# Patient Record
Sex: Female | Born: 1955 | Race: White | Hispanic: No | Smoking: Current every day smoker
Health system: Southern US, Community
[De-identification: ages and names within clinical notes are randomized; demographics above are authoritative.]

## PROBLEM LIST (undated history)

## (undated) DIAGNOSIS — C801 Malignant (primary) neoplasm, unspecified: Secondary | ICD-10-CM

## (undated) DIAGNOSIS — C50919 Malignant neoplasm of unspecified site of unspecified female breast: Secondary | ICD-10-CM

## (undated) DIAGNOSIS — M1990 Unspecified osteoarthritis, unspecified site: Secondary | ICD-10-CM

## (undated) DIAGNOSIS — C439 Malignant melanoma of skin, unspecified: Secondary | ICD-10-CM

## (undated) HISTORY — PX: TUBAL LIGATION: SHX77

## (undated) HISTORY — DX: Malignant (primary) neoplasm, unspecified: C80.1

## (undated) HISTORY — PX: LEG SURGERY: SHX1003

## (undated) HISTORY — DX: Malignant neoplasm of unspecified site of unspecified female breast: C50.919

## (undated) HISTORY — PX: OTHER SURGICAL HISTORY: SHX169

---

## 1998-10-09 DIAGNOSIS — C801 Malignant (primary) neoplasm, unspecified: Secondary | ICD-10-CM

## 1998-10-09 DIAGNOSIS — C50919 Malignant neoplasm of unspecified site of unspecified female breast: Secondary | ICD-10-CM

## 1998-10-09 HISTORY — DX: Malignant (primary) neoplasm, unspecified: C80.1

## 1998-10-09 HISTORY — DX: Malignant neoplasm of unspecified site of unspecified female breast: C50.919

## 1998-10-09 HISTORY — PX: BREAST SURGERY: SHX581

## 1999-06-14 ENCOUNTER — Encounter: Admission: RE | Admit: 1999-06-14 | Discharge: 1999-09-12 | Payer: Self-pay | Admitting: Radiation Oncology

## 2003-10-10 HISTORY — PX: FRACTURE SURGERY: SHX138

## 2005-06-18 ENCOUNTER — Emergency Department (HOSPITAL_COMMUNITY): Admission: EM | Admit: 2005-06-18 | Discharge: 2005-06-18 | Payer: Self-pay | Admitting: *Deleted

## 2006-12-10 ENCOUNTER — Ambulatory Visit: Payer: Self-pay | Admitting: Family Medicine

## 2008-06-22 ENCOUNTER — Other Ambulatory Visit: Admission: RE | Admit: 2008-06-22 | Discharge: 2008-06-22 | Payer: Self-pay | Admitting: Internal Medicine

## 2008-07-08 ENCOUNTER — Ambulatory Visit (HOSPITAL_COMMUNITY): Admission: RE | Admit: 2008-07-08 | Discharge: 2008-07-08 | Payer: Self-pay | Admitting: Internal Medicine

## 2008-07-29 ENCOUNTER — Ambulatory Visit (HOSPITAL_COMMUNITY): Admission: RE | Admit: 2008-07-29 | Discharge: 2008-07-29 | Payer: Self-pay | Admitting: Internal Medicine

## 2010-12-08 ENCOUNTER — Other Ambulatory Visit (HOSPITAL_COMMUNITY)
Admission: RE | Admit: 2010-12-08 | Discharge: 2010-12-08 | Disposition: A | Discharge status: Home / Self Care | Payer: 59 | Source: Ambulatory Visit | Attending: Family Medicine | Admitting: Family Medicine

## 2010-12-08 DIAGNOSIS — Z124 Encounter for screening for malignant neoplasm of cervix: Secondary | ICD-10-CM | POA: Insufficient documentation

## 2010-12-12 ENCOUNTER — Other Ambulatory Visit: Payer: Self-pay | Admitting: Family Medicine

## 2010-12-12 DIAGNOSIS — Z1231 Encounter for screening mammogram for malignant neoplasm of breast: Secondary | ICD-10-CM

## 2010-12-21 ENCOUNTER — Ambulatory Visit
Admission: RE | Admit: 2010-12-21 | Discharge: 2010-12-21 | Disposition: A | Discharge status: Home / Self Care | Payer: 59 | Source: Ambulatory Visit | Attending: Family Medicine | Admitting: Family Medicine

## 2010-12-21 DIAGNOSIS — Z1231 Encounter for screening mammogram for malignant neoplasm of breast: Secondary | ICD-10-CM

## 2012-02-05 ENCOUNTER — Encounter (INDEPENDENT_AMBULATORY_CARE_PROVIDER_SITE_OTHER): Payer: Self-pay

## 2012-02-09 ENCOUNTER — Encounter (INDEPENDENT_AMBULATORY_CARE_PROVIDER_SITE_OTHER): Payer: Self-pay | Admitting: Surgery

## 2012-02-09 ENCOUNTER — Ambulatory Visit (INDEPENDENT_AMBULATORY_CARE_PROVIDER_SITE_OTHER): Payer: Self-pay | Admitting: Surgery

## 2012-02-09 ENCOUNTER — Other Ambulatory Visit (INDEPENDENT_AMBULATORY_CARE_PROVIDER_SITE_OTHER): Payer: Self-pay

## 2012-02-09 VITALS — BP 108/70 | HR 68 | Temp 98.2°F | Resp 12 | Ht 68.0 in | Wt 198.0 lb

## 2012-02-09 DIAGNOSIS — D0362 Melanoma in situ of left upper limb, including shoulder: Secondary | ICD-10-CM

## 2012-02-09 DIAGNOSIS — C439 Malignant melanoma of skin, unspecified: Secondary | ICD-10-CM

## 2012-02-09 NOTE — Progress Notes (Signed)
Patient ID: Kaitlyn Martinez, female   DOB: 1956/08/16, 56 y.o.   MRN: [id number]  Chief Complaint  Patient presents with  . PT Initial Evaluation    Melanoma Left forearm    HPI Kaitlyn Martinez is a 56 y.o. female.   HPI Patient sent at the request of Dr. Terri Piedra for a melanoma on her left arm just above her wrist. He presented back in November as a clear lesion. It became red and then had a raised sore in the center of it. She underwent biopsy last month showed a superficial spreading melanoma Clark level 4     1.5 mm thickness with a brisk was response nourished in no vascular invasion. This is a T2anxmx. She denies a history of ulceration or bleeding from the area. She has had a left breast lumpectomy and a left axillary node dissection 2000 for breast cancer.  Past Medical History  Diagnosis Date  . 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 Breast cancer 2000    Past Surgical History  Procedure Date  . Breast surgery 2000    Lumpectomy    Family History  Problem Relation Age of Onset  . Heart disease Father     Social History History  Substance Use Topics  . Smoking status: Former Smoker -- 0.2 packs/day    Types: Cigarettes  . Smokeless tobacco: Not on file  . Alcohol Use: No    Allergies  Allergen Reactions  . Codeine   . Penicillins     Current Outpatient Prescriptions  Medication Sig Dispense Refill  . Multiple Vitamin (MULTIVITAMIN) tablet Take 1 tablet by mouth daily.        Review of Systems Review of Systems  Constitutional: Negative for fever, chills and unexpected weight change.  HENT: Negative for hearing loss, congestion, sore throat, trouble swallowing and voice change.   Eyes: Negative for visual disturbance.  Respiratory: Negative for cough and wheezing.   Cardiovascular: Negative for chest pain, palpitations and leg swelling.  Gastrointestinal: Negative for nausea, vomiting, abdominal pain, diarrhea, constipation, blood in stool, abdominal  distention and anal bleeding.  Genitourinary: Negative for hematuria, vaginal bleeding and difficulty urinating.  Musculoskeletal: Negative for arthralgias.  Skin: Negative for rash and wound.  Neurological: Negative for seizures, syncope and headaches.  Hematological: Negative for adenopathy. Does not bruise/bleed easily.  Psychiatric/Behavioral: Negative for confusion.    Blood pressure 108/70, pulse 68, temperature 98.2 F (36.8 C), temperature source Temporal, resp. rate 12, height 5\' 8"  (1.727 m), weight 198 lb (89.812 kg).  Physical Exam Physical Exam  Constitutional: She is oriented to person, place, and time. She appears well-developed and well-nourished.  HENT:  Head: Normocephalic and atraumatic.  Eyes: EOM are normal. Pupils are equal, round, and reactive to light.  Neck: Normal range of motion. Neck supple.  Cardiovascular: Normal rate and regular rhythm.   Pulmonary/Chest: Effort normal and breath sounds normal.  Abdominal: Soft. Bowel sounds are normal.  Musculoskeletal: Normal range of motion.  Neurological: She is alert and oriented to person, place, and time.  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 She has had a left axillary lymph node dissection which may make sentinel lymph node mapping not possible. I will set her up for a lymphoscintigraphy to evaluate lymphatic drainage in this extremity prior  surgery. If she does have lymphatics and to map, and a sentinel lymph node procedure could be attempted. Risk of bleeding, infection, lymphedema, and the need for further surgery possible complications of nerve injury, vascular injury, worsening of free operative state. She agrees to proceed. Palpation, alternatives to surgery and adjuvant therapies discussed the       Kaitlyn Martinez A. 02/09/2012, 10:32 AM

## 2012-02-09 NOTE — Patient Instructions (Signed)
Melanoma  Melanoma is the least common, but most dangerous, form of skin cancer. This is because it can spread (metastasize) to other organs and can be life-threatening. Melanoma is a cancerous (malignant) tumor that begins in a certain type of cells, called melanocytes. Melanocytes are the cells that produce the color (pigment) called melanin. Melanin colors our skin, hair, eyes, and moles.  CAUSES    The exact cause of melanoma is unknown. You may have a higher risk if you:   Spend or have spent a lot of time in the sun. This includes sunlamp and tanning booth exposure.    Have had sunburns. This put you at a particularly increased risk for melanoma. The more blistering sunburns a person has, the higher the risk.    Spend time in parts of the world with more intense sunlight.    Have fair skin that does not tan easily. You may have a lower risk if you have a darker skin color. However, people with darker skin can get melanoma, especially on the hands and feet (acral areas).    Have a close relative (parent, sibling) who has melanoma.    Have a large number of skin moles (more than 100).   SYMPTOMS    A skin mole is suspicious if it has any of these 5 traits. This is called the ABCDE's of melanoma:   Asymmetry: Irregular shape, not simply round or oval.    Border: Edge of the mole is irregular, not smooth.    Color: Mole may have multiple colors in it, including brown, black, blue, red, or tan.    Diameter: More than 0.2 inches (6 mm) across.    Evolving: Any unusual change or symptoms in the mole, such as pain, itching, stinging, sensitivity, or bleeding.   A mole that is noticeably changing in appearance, or any new mole, should be checked for melanoma. In general, people develop new moles until age 30. New moles after this age should be brought to the attention of your caregiver.  DIAGNOSIS     Your caregiver can look at your skin and find lesions or moles that may be suspicious. A patient may also notice a mole with symptoms or a mole that does not look like most of the other moles on his or her body. This is called the "ugly duckling" sign. A tissue sample (biopsy) examined under a microscope is needed to determine if it is melanoma. The size and extent of the biopsy will depend on the location, size, and appearance of the skin lesion or mole. The biopsy can also reveal whether melanoma has spread to deeper layers of the skin.  TREATMENT    Surgery to completely remove the melanoma is required. Lymph nodes may also be removed. If the melanoma has spread to other organs, such as the liver, lungs, bone, or brain, cancer-fighting drugs (chemotherapy) must be used. Your caregiver will discuss your treatment options with you. You can ask about being included in a clinical trial to evaluate new forms of treatment. Melanoma can occasionally recur years after the initial diagnosis. If you have melanoma, you will need follow-up visits with your caregiver for many years.  PREVENTION    Risk for melanoma can be reduced by minimizing sun exposure. Practice the 3 S's:   Slip on a shirt.    Slop on sunscreen.    Slap on a hat.   Do not spend time in the sun during peak midafternoon hours. Sunscreen/sunblock with SPF   30 or higher and UVA/UVB block should be applied regularly. You should do this even during brief exposure to sunlight. You should also do this on cloudy days and in winter, even though the perceived sunlight is less. Always avoid sunburn! Wear sunglasses that block UV light. Be sure to see your caregiver if you have any new or changing moles.  HOME CARE INSTRUCTIONS     Follow wound care instructions after surgical removal of your melanoma.    Practice good sun avoidance and protective measures as described above.     Let your close family members (parents, children, siblings) know about your diagnosis. This puts them at a higher risk of getting melanoma than the general population.   SEEK MEDICAL CARE IF:     You notice any new moles, or you have any moles that are changing.    You have had a melanoma removed and you notice a new growth near the same location.    You have had a melanoma removed and you experience any new or unexplained health problems.   Document Released: 09/25/2005 Document Revised: 09/14/2011 Document Reviewed: 01/14/2010  [employer] Patient Information 2012 [employer]

## 2012-02-15 ENCOUNTER — Encounter (HOSPITAL_COMMUNITY)
Admission: RE | Admit: 2012-02-15 | Discharge: 2012-02-15 | Disposition: A | Discharge status: Home / Self Care | Payer: Self-pay | Source: Ambulatory Visit | Attending: Surgery | Admitting: Surgery

## 2012-02-15 DIAGNOSIS — C439 Malignant melanoma of skin, unspecified: Secondary | ICD-10-CM

## 2012-02-15 DIAGNOSIS — C4360 Malignant melanoma of unspecified upper limb, including shoulder: Secondary | ICD-10-CM | POA: Insufficient documentation

## 2012-02-15 MED ORDER — TECHNETIUM TC 99M SULFUR COLLOID FILTERED
0.5000 | Freq: Once | INTRAVENOUS | Status: AC | PRN
  Administered 2012-02-15: 0.5 via INTRADERMAL

## 2012-03-05 ENCOUNTER — Encounter (HOSPITAL_BASED_OUTPATIENT_CLINIC_OR_DEPARTMENT_OTHER): Payer: Self-pay | Admitting: *Deleted

## 2012-03-05 NOTE — Progress Notes (Signed)
To come in for labs Told to call business office to set up some kind of payments

## 2012-03-06 ENCOUNTER — Encounter (HOSPITAL_BASED_OUTPATIENT_CLINIC_OR_DEPARTMENT_OTHER)
Admission: RE | Admit: 2012-03-06 | Discharge: 2012-03-06 | Disposition: A | Discharge status: Home / Self Care | Payer: Self-pay | Source: Ambulatory Visit | Attending: Surgery | Admitting: Surgery

## 2012-03-06 ENCOUNTER — Ambulatory Visit
Admission: RE | Admit: 2012-03-06 | Discharge: 2012-03-06 | Disposition: A | Discharge status: Home / Self Care | Payer: No Typology Code available for payment source | Source: Ambulatory Visit | Attending: Surgery | Admitting: Surgery

## 2012-03-06 LAB — COMPREHENSIVE METABOLIC PANEL
AST: 24 U/L (ref 0–37)
Albumin: 3.9 g/dL (ref 3.5–5.2)
Alkaline Phosphatase: 88 U/L (ref 39–117)
BUN: 11 mg/dL (ref 6–23)
Chloride: 103 mEq/L (ref 96–112)
Potassium: 4.8 mEq/L (ref 3.5–5.1)
Total Protein: 7.9 g/dL (ref 6.0–8.3)

## 2012-03-06 LAB — CBC
HCT: 41.5 % (ref 36.0–46.0)
MCHC: 32.8 g/dL (ref 30.0–36.0)
Platelets: 227 10*3/uL (ref 150–400)
RDW: 13.1 % (ref 11.5–15.5)
WBC: 7.8 10*3/uL (ref 4.0–10.5)

## 2012-03-06 LAB — DIFFERENTIAL
Eosinophils Absolute: 0.2 10*3/uL (ref 0.0–0.7)
Lymphs Abs: 2.2 10*3/uL (ref 0.7–4.0)
Monocytes Absolute: 0.6 10*3/uL (ref 0.1–1.0)
Monocytes Relative: 8 % (ref 3–12)
Neutro Abs: 4.8 10*3/uL (ref 1.7–7.7)
Neutrophils Relative %: 61 % (ref 43–77)

## 2012-03-07 ENCOUNTER — Encounter (HOSPITAL_BASED_OUTPATIENT_CLINIC_OR_DEPARTMENT_OTHER): Payer: Self-pay | Admitting: *Deleted

## 2012-03-07 ENCOUNTER — Encounter (HOSPITAL_BASED_OUTPATIENT_CLINIC_OR_DEPARTMENT_OTHER)
Admission: RE | Disposition: A | Discharge status: Home / Self Care | Payer: Self-pay | Source: Ambulatory Visit | Attending: Surgery

## 2012-03-07 ENCOUNTER — Ambulatory Visit (HOSPITAL_COMMUNITY)
Admission: RE | Admit: 2012-03-07 | Discharge: 2012-03-07 | Disposition: A | Discharge status: Home / Self Care | Payer: Self-pay | Source: Ambulatory Visit | Attending: Surgery | Admitting: Surgery

## 2012-03-07 ENCOUNTER — Ambulatory Visit (HOSPITAL_BASED_OUTPATIENT_CLINIC_OR_DEPARTMENT_OTHER)
Admission: RE | Admit: 2012-03-07 | Discharge: 2012-03-07 | Disposition: A | Discharge status: Home / Self Care | Payer: Self-pay | Source: Ambulatory Visit | Attending: Surgery | Admitting: Surgery

## 2012-03-07 ENCOUNTER — Ambulatory Visit (HOSPITAL_BASED_OUTPATIENT_CLINIC_OR_DEPARTMENT_OTHER): Payer: Self-pay | Admitting: *Deleted

## 2012-03-07 DIAGNOSIS — C4360 Malignant melanoma of unspecified upper limb, including shoulder: Secondary | ICD-10-CM | POA: Insufficient documentation

## 2012-03-07 DIAGNOSIS — D0362 Melanoma in situ of left upper limb, including shoulder: Secondary | ICD-10-CM

## 2012-03-07 DIAGNOSIS — Z853 Personal history of malignant neoplasm of breast: Secondary | ICD-10-CM | POA: Insufficient documentation

## 2012-03-07 HISTORY — PX: OTHER SURGICAL HISTORY: SHX169

## 2012-03-07 HISTORY — DX: Unspecified osteoarthritis, unspecified site: M19.90

## 2012-03-07 HISTORY — PX: MELANOMA EXCISION: SHX5266

## 2012-03-07 SURGERY — EXCISION, MELANOMA, WITH SENTINEL LYMPH NODE BIOPSY
Anesthesia: General | Site: Arm Lower | Laterality: Left | Wound class: Clean

## 2012-03-07 MED ORDER — ONDANSETRON HCL 4 MG/2ML IJ SOLN: 4.0000 mg | Freq: Once | INTRAMUSCULAR | Status: DC | PRN

## 2012-03-07 MED ORDER — MIDAZOLAM HCL 2 MG/2ML IJ SOLN
0.5000 mg | INTRAMUSCULAR | Status: DC | PRN
  Administered 2012-03-07: 1 mg via INTRAVENOUS

## 2012-03-07 MED ORDER — LIDOCAINE HCL (CARDIAC) 20 MG/ML IV SOLN
INTRAVENOUS | Status: DC | PRN
  Administered 2012-03-07: 50 mg via INTRAVENOUS

## 2012-03-07 MED ORDER — ONDANSETRON HCL 4 MG PO TABS: 4.0000 mg | ORAL_TABLET | Freq: Three times a day (TID) | ORAL | Status: AC | PRN

## 2012-03-07 MED ORDER — OXYCODONE-ACETAMINOPHEN 5-325 MG PO TABS
1.0000 | ORAL_TABLET | Freq: Once | ORAL | Status: AC | PRN
  Administered 2012-03-07: 1 via ORAL

## 2012-03-07 MED ORDER — PROPOFOL 10 MG/ML IV EMUL
INTRAVENOUS | Status: DC | PRN
  Administered 2012-03-07: 200 mg via INTRAVENOUS

## 2012-03-07 MED ORDER — LACTATED RINGERS IV SOLN
INTRAVENOUS | Status: DC
  Administered 2012-03-07 (×3): via INTRAVENOUS

## 2012-03-07 MED ORDER — TECHNETIUM TC 99M SULFUR COLLOID FILTERED
0.5000 | Freq: Once | INTRAVENOUS | Status: AC | PRN
  Administered 2012-03-07: 0.5 via INTRADERMAL

## 2012-03-07 MED ORDER — OXYCODONE-ACETAMINOPHEN 10-325 MG PO TABS: 1.0000 | ORAL_TABLET | Freq: Four times a day (QID) | ORAL | Status: DC | PRN

## 2012-03-07 MED ORDER — MIDAZOLAM HCL 5 MG/5ML IJ SOLN
INTRAMUSCULAR | Status: DC | PRN
  Administered 2012-03-07: 1 mg via INTRAVENOUS

## 2012-03-07 MED ORDER — HYDROMORPHONE HCL PF 1 MG/ML IJ SOLN
0.2500 mg | INTRAMUSCULAR | Status: DC | PRN
  Administered 2012-03-07: 0.25 mg via INTRAVENOUS
  Administered 2012-03-07 (×2): 0.5 mg via INTRAVENOUS
  Administered 2012-03-07: 0.25 mg via INTRAVENOUS

## 2012-03-07 MED ORDER — BUPIVACAINE-EPINEPHRINE 0.25% -1:200000 IJ SOLN
INTRAMUSCULAR | Status: DC | PRN
  Administered 2012-03-07: 20 mL

## 2012-03-07 MED ORDER — BUPIVACAINE HCL (PF) 0.25 % IJ SOLN
INTRAMUSCULAR | Status: DC | PRN
  Administered 2012-03-07: 9.5 mL

## 2012-03-07 MED ORDER — ONDANSETRON HCL 4 MG/2ML IJ SOLN
INTRAMUSCULAR | Status: DC | PRN
  Administered 2012-03-07: 4 mg via INTRAVENOUS

## 2012-03-07 MED ORDER — VANCOMYCIN HCL IN DEXTROSE 1-5 GM/200ML-% IV SOLN
1000.0000 mg | INTRAVENOUS | Status: AC
  Administered 2012-03-07: 1000 mg via INTRAVENOUS

## 2012-03-07 MED ORDER — DEXAMETHASONE SODIUM PHOSPHATE 4 MG/ML IJ SOLN
INTRAMUSCULAR | Status: DC | PRN
  Administered 2012-03-07: 10 mg via INTRAVENOUS

## 2012-03-07 MED ORDER — METHYLENE BLUE 1 % INJ SOLN
INTRAMUSCULAR | Status: DC | PRN
  Administered 2012-03-07: 1 mL via INTRADERMAL

## 2012-03-07 MED ORDER — FENTANYL CITRATE 0.05 MG/ML IJ SOLN
50.0000 ug | INTRAMUSCULAR | Status: DC | PRN
  Administered 2012-03-07: 50 ug via INTRAVENOUS

## 2012-03-07 MED ORDER — FENTANYL CITRATE 0.05 MG/ML IJ SOLN
INTRAMUSCULAR | Status: DC | PRN
  Administered 2012-03-07 (×3): 50 ug via INTRAVENOUS

## 2012-03-07 SURGICAL SUPPLY — 71 items
ADH SKN CLS APL DERMABOND .7 (GAUZE/BANDAGES/DRESSINGS) ×2
APL SKNCLS STERI-STRIP NONHPOA (GAUZE/BANDAGES/DRESSINGS) ×1
APPLIER CLIP 9.375 MED OPEN (MISCELLANEOUS) ×2
BALL CTTN LRG ABS STRL LF (GAUZE/BANDAGES/DRESSINGS) ×3
BANDAGE ELASTIC 4 VELCRO ST LF (GAUZE/BANDAGES/DRESSINGS) ×2 IMPLANT
BANDAGE ELASTIC 6 VELCRO ST LF (GAUZE/BANDAGES/DRESSINGS) IMPLANT
BANDAGE GAUZE ELAST BULKY 4 IN (GAUZE/BANDAGES/DRESSINGS) ×2 IMPLANT
BENZOIN TINCTURE PRP APPL 2/3 (GAUZE/BANDAGES/DRESSINGS) ×2 IMPLANT
BLADE SURG 10 STRL SS (BLADE) ×2 IMPLANT
BLADE SURG 15 STRL LF DISP TIS (BLADE) ×1 IMPLANT
BLADE SURG 15 STRL SS (BLADE) ×2
BLADE SURG ROTATE 9660 (MISCELLANEOUS) IMPLANT
BNDG PLASTER X FAST 3X3 WHT LF (CAST SUPPLIES) ×10 IMPLANT
CANISTER SUCTION 1200CC (MISCELLANEOUS) ×2 IMPLANT
CHLORAPREP W/TINT 26ML (MISCELLANEOUS) ×2 IMPLANT
CLIP APPLIE 9.375 MED OPEN (MISCELLANEOUS) ×1 IMPLANT
CLOTH BEACON ORANGE TIMEOUT ST (SAFETY) ×2 IMPLANT
COTTONBALL LRG STERILE PKG (GAUZE/BANDAGES/DRESSINGS) ×6 IMPLANT
COVER MAYO STAND STRL (DRAPES) ×2 IMPLANT
COVER PROBE W GEL 5X96 (DRAPES) ×2 IMPLANT
COVER TABLE BACK 60X90 (DRAPES) ×2 IMPLANT
DECANTER SPIKE VIAL GLASS SM (MISCELLANEOUS) IMPLANT
DERMABOND ADVANCED (GAUZE/BANDAGES/DRESSINGS) ×2
DERMABOND ADVANCED .7 DNX12 (GAUZE/BANDAGES/DRESSINGS) ×2 IMPLANT
DRAPE PED LAPAROTOMY (DRAPES) ×2 IMPLANT
DRAPE UTILITY XL STRL (DRAPES) ×2 IMPLANT
DRSG TEGADERM 4X4.75 (GAUZE/BANDAGES/DRESSINGS) ×4 IMPLANT
ELECT COATED BLADE 2.86 ST (ELECTRODE) ×2 IMPLANT
ELECT REM PT RETURN 9FT ADLT (ELECTROSURGICAL) ×2
ELECTRODE REM PT RTRN 9FT ADLT (ELECTROSURGICAL) ×1 IMPLANT
GAUZE SPONGE 4X4 12PLY STRL LF (GAUZE/BANDAGES/DRESSINGS) IMPLANT
GAUZE XEROFORM 5X9 LF (GAUZE/BANDAGES/DRESSINGS) ×2 IMPLANT
GLOVE BIOGEL M 7.0 STRL (GLOVE) ×2 IMPLANT
GLOVE BIOGEL PI IND STRL 7.5 (GLOVE) ×1 IMPLANT
GLOVE BIOGEL PI IND STRL 8 (GLOVE) ×1 IMPLANT
GLOVE BIOGEL PI INDICATOR 7.5 (GLOVE) ×1
GLOVE BIOGEL PI INDICATOR 8 (GLOVE) ×1
GLOVE ECLIPSE 8.0 STRL XLNG CF (GLOVE) ×2 IMPLANT
GOWN PREVENTION PLUS XLARGE (GOWN DISPOSABLE) ×4 IMPLANT
HEMOSTAT SURGICEL 2X14 (HEMOSTASIS) ×2 IMPLANT
NDL SAFETY ECLIPSE 18X1.5 (NEEDLE) IMPLANT
NEEDLE HYPO 18GX1.5 SHARP (NEEDLE)
NEEDLE HYPO 25X1 1.5 SAFETY (NEEDLE) ×2 IMPLANT
NS IRRIG 1000ML POUR BTL (IV SOLUTION) ×2 IMPLANT
PACK BASIN DAY SURGERY FS (CUSTOM PROCEDURE TRAY) ×2 IMPLANT
PENCIL BUTTON HOLSTER BLD 10FT (ELECTRODE) ×2 IMPLANT
SHEET MEDIUM DRAPE 40X70 STRL (DRAPES) ×2 IMPLANT
SLEEVE SCD COMPRESS KNEE MED (MISCELLANEOUS) ×2 IMPLANT
SPONGE GAUZE 4X4 12PLY (GAUZE/BANDAGES/DRESSINGS) ×2 IMPLANT
SPONGE LAP 4X18 X RAY DECT (DISPOSABLE) ×2 IMPLANT
STAPLER VISISTAT 35W (STAPLE) ×2 IMPLANT
STOCKINETTE 4X48 STRL (DRAPES) ×2 IMPLANT
STRIP CLOSURE SKIN 1/2X4 (GAUZE/BANDAGES/DRESSINGS) ×2 IMPLANT
SUT CHROMIC 3 0 PS 2 (SUTURE) ×10 IMPLANT
SUT ETHILON 2 0 FS 18 (SUTURE) ×6 IMPLANT
SUT ETHILON 3 0 PS 1 (SUTURE) ×2 IMPLANT
SUT MON AB 4-0 PC3 18 (SUTURE) ×2 IMPLANT
SUT SILK 2 0 SH (SUTURE) ×2 IMPLANT
SUT VIC AB 2-0 SH 27 (SUTURE)
SUT VIC AB 2-0 SH 27XBRD (SUTURE) IMPLANT
SUT VICRYL 3-0 CR8 SH (SUTURE) ×2 IMPLANT
SUT VICRYL 4-0 PS2 18IN ABS (SUTURE) ×2 IMPLANT
SYR BULB 3OZ (MISCELLANEOUS) ×2 IMPLANT
SYR CONTROL 10ML LL (SYRINGE) ×2 IMPLANT
SYR TB 1ML 26GX3/8 SAFETY (SYRINGE) ×2 IMPLANT
TOWEL OR 17X24 6PK STRL BLUE (TOWEL DISPOSABLE) ×2 IMPLANT
TOWEL OR NON WOVEN STRL DISP B (DISPOSABLE) ×2 IMPLANT
TUBE CONNECTING 20X1/4 (TUBING) ×2 IMPLANT
UNDERPAD 30X30 INCONTINENT (UNDERPADS AND DIAPERS) ×2 IMPLANT
WATER STERILE IRR 1000ML POUR (IV SOLUTION) IMPLANT
YANKAUER SUCT BULB TIP NO VENT (SUCTIONS) ×2 IMPLANT

## 2012-03-07 NOTE — Discharge Instructions (Signed)

## 2012-03-07 NOTE — Op Note (Signed)
Preoperative diagnosis: Left upper extremity melanoma  Postoperative diagnosis: Same  Procedure: Wide excision of left upper extremity melanoma with left axillary sentinel lymph node mapping and closure of defect with full-thickness 5 x 7 cm skin graft  Surgeon: Harriette Bouillon M.D.  Anesthesia: LMA with 0.25% Sensorcaine plain and 0.25% Sensorcaine with epinephrine local  EBL: Less than 30 cc  Specimen: Left forearm skin with melanoma and 2 left axillary sentinel lymph nodes to pathology  Drains: None  Indications for procedure: The patient presents for excision of left upper extremity melanoma located on the dorsal aspect of her left forearm approximately 5 inches above the wrist. This a large melanoma. Risk of bleeding, infection, blood vessel injury, nerve injury, the need for further surgery, arm swelling, shoulder pain, and the need for possible complex closure discussed. She understood the risk and agree to proceed.  Description of procedure: The patient was seen in the holding area. She was injected by nuclear medicine for her node mapping. Questions are answered. She's taken back to the operating room. The left upper shotty was marked in the holding area. After induction of LMA anesthesia under sterile condition I injected 4 cc of methylene blue dye into the melanoma located left forearm. We then prepped and draped left extremity a sterile fashion to include the axilla. Neoprobe was used to the iliac activity was in the left axilla. There is no activity in the left neck or supraclavicular region. Incision made a left axilla. Dissection was carried down to left axilla. She of previous left axillary lymph node dissection. 2 blue and hot nodes were identified and these are level II lymph nodes. No other nodes noted. Long thoracic and thoracodorsal trunk was not involved in the dissection. Surgicel placed in the wound.  Next, wide excision of melanoma of the left forearm was done. A 1 cm  margin was marked from the edge the melanoma circumferentially. Curvilinear incision was made around the melanoma. The lesion was excised down to the fascia of the forearm. The defect measured 5 x 7 cm. Given its location I mobilized the skin off the subcutaneous fat that is not good enough to close the defect without undue tension and loss of some motion at her wrist. I felt that full-thickness skin graft was necessary for closure. Donor site was chosen in the left inner upper arm where she excess skin. A mark was used and an elliptical 5 x 7 cm skin was excised the subcutaneous fat. The fat was removed from the skin. The skin was fenestrated with a scalpel. It was sewn into the defect with 3-0 chromic. Irrigation was used. A posterior was created using Xeroform and cotton balls and a single stitch of 2-0 nylon was placed to hold the posterior over the skin graft. The forearm was wrapped with Curlex, padding and a large Ace wrap. This was not too tight. Skin site closed with 4-0 Vicryl in a subcuticular stitch. Axilla closed with 3-0 Vicryl and 4-0 Monocryl. All final counts sponge, needle and this was found to be correct. Dermabond applied to done her site and left axilla. She was awoke extubated taken recovery in satisfactory condition.

## 2012-03-07 NOTE — Progress Notes (Signed)
Sentinal node injection complete.  Monitors and oxygen on throughout.  VS  WDL throughout.  Family to bedside, rails up.

## 2012-03-07 NOTE — Anesthesia Preprocedure Evaluation (Addendum)

## 2012-03-07 NOTE — H&P (View-Only) (Signed)
Patient ID: Kaitlyn Martinez, female   DOB: 01/23/1956, 56 y.o.   MRN: [id number]  Chief Complaint  Patient presents with  . PT Initial Evaluation    Melanoma Left forearm    HPI Kaitlyn Martinez is a 56 y.o. female.   HPI Patient sent at the request of Dr. Lupton for a melanoma on her left arm just above her wrist. He presented back in November as a clear lesion. It became red and then had a raised sore in the center of it. She underwent biopsy last month showed a superficial spreading melanoma Clark level 4     1.5 mm thickness with a brisk was response nourished in no vascular invasion. This is a T2anxmx. She denies a history of ulceration or bleeding from the area. She has had a left breast lumpectomy and a left axillary node dissection 2000 for breast cancer.  Past Medical History  Diagnosis Date  . 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 Breast cancer 2000    Past Surgical History  Procedure Date  . Breast surgery 2000    Lumpectomy    Family History  Problem Relation Age of Onset  . Heart disease Father     Social History History  Substance Use Topics  . Smoking status: Former Smoker -- 0.2 packs/day    Types: Cigarettes  . Smokeless tobacco: Not on file  . Alcohol Use: No    Allergies  Allergen Reactions  . Codeine   . Penicillins     Current Outpatient Prescriptions  Medication Sig Dispense Refill  . Multiple Vitamin (MULTIVITAMIN) tablet Take 1 tablet by mouth daily.        Review of Systems Review of Systems  Constitutional: Negative for fever, chills and unexpected weight change.  HENT: Negative for hearing loss, congestion, sore throat, trouble swallowing and voice change.   Eyes: Negative for visual disturbance.  Respiratory: Negative for cough and wheezing.   Cardiovascular: Negative for chest pain, palpitations and leg swelling.  Gastrointestinal: Negative for nausea, vomiting, abdominal pain, diarrhea, constipation, blood in stool, abdominal  distention and anal bleeding.  Genitourinary: Negative for hematuria, vaginal bleeding and difficulty urinating.  Musculoskeletal: Negative for arthralgias.  Skin: Negative for rash and wound.  Neurological: Negative for seizures, syncope and headaches.  Hematological: Negative for adenopathy. Does not bruise/bleed easily.  Psychiatric/Behavioral: Negative for confusion.    Blood pressure 108/70, pulse 68, temperature 98.2 F (36.8 C), temperature source Temporal, resp. rate 12, height 5' 8" (1.727 m), weight 198 lb (89.812 kg).  Physical Exam Physical Exam  Constitutional: She is oriented to person, place, and time. She appears well-developed and well-nourished.  HENT:  Head: Normocephalic and atraumatic.  Eyes: EOM are normal. Pupils are equal, round, and reactive to light.  Neck: Normal range of motion. Neck supple.  Cardiovascular: Normal rate and regular rhythm.   Pulmonary/Chest: Effort normal and breath sounds normal.  Abdominal: Soft. Bowel sounds are normal.  Musculoskeletal: Normal range of motion.  Neurological: She is alert and oriented to person, place, and time.  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 She has had a left axillary lymph node dissection which may make sentinel lymph node mapping not possible. I will set her up for a lymphoscintigraphy to evaluate lymphatic drainage in this extremity prior   surgery. If she does have lymphatics and to map, and a sentinel lymph node procedure could be attempted. Risk of bleeding, infection, lymphedema, and the need for further surgery possible complications of nerve injury, vascular injury, worsening of free operative state. She agrees to proceed. Palpation, alternatives to surgery and adjuvant therapies discussed the       [name] A. 02/09/2012, 10:32 AM    

## 2012-03-07 NOTE — Anesthesia Postprocedure Evaluation (Signed)
Anesthesia Post Note  Patient: Kaitlyn Martinez  Procedure(s) Performed: Procedure(s) (LRB): EXCISION MELANOMA WITH SENTINEL LYMPH NODE BIOPSY (Left)  Anesthesia type: general  Patient location: PACU  Post pain: Pain level controlled  Post assessment: Patient's Cardiovascular Status Stable  Last Vitals:  Filed Vitals:   03/07/12 1130  BP: 129/78  Pulse: 73  Resp: 12    Post vital signs: Reviewed and stable  Level of consciousness: sedated  Complications: No apparent anesthesia complications

## 2012-03-07 NOTE — Interval H&P Note (Signed)
History and Physical Interval Note:  03/07/2012 8:57 AM  Kaitlyn Martinez  has presented today for surgery, with the diagnosis of Melanoma Left Lower Arm  The various methods of treatment have been discussed with the patient and family. After consideration of risks, benefits and other options for treatment, the patient has consented to  Procedure(s) (LRB): EXCISION MELANOMA WITH SENTINEL LYMPH NODE BIOPSY (Left) as a surgical intervention .  The patients' history has been reviewed, patient examined, no change in status, stable for surgery.  I have reviewed the patients' chart and labs.  Questions were answered to the patient's satisfaction.     Eden Rho A.

## 2012-03-07 NOTE — Anesthesia Procedure Notes (Signed)
Procedure Name: LMA Insertion Date/Time: 03/07/2012 9:11 AM Performed by: Meyer Russel Pre-anesthesia Checklist: Patient identified, Emergency Drugs available, Suction available and Patient being monitored Patient Re-evaluated:Patient Re-evaluated prior to inductionOxygen Delivery Method: Circle System Utilized Preoxygenation: Pre-oxygenation with 100% oxygen Intubation Type: IV induction Ventilation: Mask ventilation without difficulty LMA: LMA inserted LMA Size: 4.0 Number of attempts: 1 Airway Equipment and Method: bite block Placement Confirmation: positive ETCO2 and breath sounds checked- equal and bilateral Tube secured with: Tape Dental Injury: Teeth and Oropharynx as per pre-operative assessment

## 2012-03-07 NOTE — Transfer of Care (Signed)
Immediate Anesthesia Transfer of Care Note  Patient: Kaitlyn Martinez  Procedure(s) Performed: Procedure(s) (LRB): EXCISION MELANOMA WITH SENTINEL LYMPH NODE BIOPSY (Left)  Patient Location: PACU  Anesthesia Type: General  Level of Consciousness: awake and alert   Airway & Oxygen Therapy: Patient Spontanous Breathing and Patient connected to face mask oxygen  Post-op Assessment: Report given to PACU RN, Post -op Vital signs reviewed and stable and Patient moving all extremities  Post vital signs: Reviewed and stable  Complications: No apparent anesthesia complications

## 2012-03-12 ENCOUNTER — Encounter (INDEPENDENT_AMBULATORY_CARE_PROVIDER_SITE_OTHER): Payer: Self-pay | Admitting: Surgery

## 2012-03-12 ENCOUNTER — Ambulatory Visit (INDEPENDENT_AMBULATORY_CARE_PROVIDER_SITE_OTHER): Payer: Self-pay | Admitting: Surgery

## 2012-03-12 VITALS — BP 106/68 | HR 79 | Temp 97.6°F | Ht 68.0 in | Wt 197.2 lb

## 2012-03-12 DIAGNOSIS — Z9889 Other specified postprocedural states: Secondary | ICD-10-CM

## 2012-03-12 NOTE — Progress Notes (Signed)
Patient returns in followup after excision of left forearm melanoma and full thickness skin graft. The final pathology is not out but I spoke with the lab it looks like the margins are negative and her 2 sentinel nodes are without metastasis. Final thickness appears to be 0.19 mm superficial spreading. I'm not sure this is total thickness or just a thickness in the specimen and will wait for final report.  Exam: I removed the dressing and bolster to her left forearm skin graft. The skin graft looks good. It appears to be taking. I placed Neosporin and Xeroform over it and we wrapped it. Her donor site and lymph node biopsy sites are clean dry and intact.  Impression: Superficial spreading melanoma left forearm status post wide excision with subsequent full thickness skin graft and sentinel lymph node mapping pathology pending  Plan: Return in one week. Continue wound care.

## 2012-03-12 NOTE — Patient Instructions (Signed)
Change dressing twice a day.  OK to shower.  Apply neosporin or polysporin to graft  Twice a day and cover with xeroform dressing and wrap with ACE wrap.

## 2012-03-18 ENCOUNTER — Ambulatory Visit (INDEPENDENT_AMBULATORY_CARE_PROVIDER_SITE_OTHER): Payer: Self-pay | Admitting: Surgery

## 2012-03-18 ENCOUNTER — Encounter (INDEPENDENT_AMBULATORY_CARE_PROVIDER_SITE_OTHER): Payer: Self-pay | Admitting: Surgery

## 2012-03-18 VITALS — BP 118/70 | HR 64 | Temp 97.8°F | Resp 16 | Ht 68.0 in | Wt 200.2 lb

## 2012-03-18 DIAGNOSIS — Z9889 Other specified postprocedural states: Secondary | ICD-10-CM

## 2012-03-18 NOTE — Progress Notes (Signed)
Patient returns after wide excision of left forearm melanoma. This was a T2aN0Mx.  she is doing well.  Exam: Left forearm wound healing well the skin graft. Excellent granulation with 100% take.  Skin harvest site and lymph node sites clean dry and intact. Impression: Stage II melanoma left forearm status post wide excision  Plan: To oncology. Continue present wound care.. Return in 2 weeks. He and range of motion exercises left wrist.

## 2012-03-18 NOTE — Patient Instructions (Signed)
Return 2 weeks.  Continue present wound care.

## 2012-03-22 ENCOUNTER — Telehealth: Payer: Self-pay | Admitting: Oncology

## 2012-03-22 NOTE — Telephone Encounter (Signed)
S/w pt re appt for 6/19 @ 10:30 am w/FS - pt will see FS instead of MM due to lack of availability.

## 2012-03-24 ENCOUNTER — Other Ambulatory Visit: Payer: Self-pay | Admitting: Oncology

## 2012-03-24 DIAGNOSIS — C439 Malignant melanoma of skin, unspecified: Secondary | ICD-10-CM

## 2012-03-26 ENCOUNTER — Telehealth: Payer: Self-pay | Admitting: Internal Medicine

## 2012-03-26 NOTE — Telephone Encounter (Signed)
Referred by Dr. Harriette Bouillon Dx- Melanoma

## 2012-03-27 ENCOUNTER — Other Ambulatory Visit (HOSPITAL_BASED_OUTPATIENT_CLINIC_OR_DEPARTMENT_OTHER): Payer: Self-pay | Admitting: Lab

## 2012-03-27 ENCOUNTER — Telehealth: Payer: Self-pay | Admitting: Oncology

## 2012-03-27 ENCOUNTER — Encounter: Payer: Self-pay | Admitting: Oncology

## 2012-03-27 ENCOUNTER — Ambulatory Visit: Payer: Self-pay

## 2012-03-27 ENCOUNTER — Ambulatory Visit (HOSPITAL_BASED_OUTPATIENT_CLINIC_OR_DEPARTMENT_OTHER): Payer: Self-pay | Admitting: Oncology

## 2012-03-27 VITALS — BP 120/76 | HR 75 | Temp 96.8°F | Ht 67.0 in | Wt 200.0 lb

## 2012-03-27 DIAGNOSIS — C4360 Malignant melanoma of unspecified upper limb, including shoulder: Secondary | ICD-10-CM

## 2012-03-27 DIAGNOSIS — C439 Malignant melanoma of skin, unspecified: Secondary | ICD-10-CM

## 2012-03-27 DIAGNOSIS — C50919 Malignant neoplasm of unspecified site of unspecified female breast: Secondary | ICD-10-CM

## 2012-03-27 LAB — CBC WITH DIFFERENTIAL/PLATELET
BASO%: 0.9 % (ref 0.0–2.0)
EOS%: 2.9 % (ref 0.0–7.0)
HCT: 39.2 % (ref 34.8–46.6)
LYMPH%: 30.4 % (ref 14.0–49.7)
MCH: 29.4 pg (ref 25.1–34.0)
MCHC: 33.6 g/dL (ref 31.5–36.0)
MONO%: 7.4 % (ref 0.0–14.0)
NEUT%: 58.4 % (ref 38.4–76.8)
Platelets: 199 10*3/uL (ref 145–400)
RBC: 4.48 10*6/uL (ref 3.70–5.45)

## 2012-03-27 LAB — COMPREHENSIVE METABOLIC PANEL
ALT: 23 U/L (ref 0–35)
AST: 17 U/L (ref 0–37)
Alkaline Phosphatase: 79 U/L (ref 39–117)
Creatinine, Ser: 0.76 mg/dL (ref 0.50–1.10)
Sodium: 141 mEq/L (ref 135–145)
Total Bilirubin: 0.2 mg/dL — ABNORMAL LOW (ref 0.3–1.2)

## 2012-03-27 LAB — LACTATE DEHYDROGENASE: LDH: 124 U/L (ref 94–250)

## 2012-03-27 NOTE — Telephone Encounter (Signed)
Gave pt appt for December 2013 lab and MD 

## 2012-03-27 NOTE — Progress Notes (Signed)
CC:   Frederick A. Worthy Rancher, M.D. Carilyn Goodpasture Docia Chuck, MD Clovis Pu. Cornett, M.D.  REFERRING PHYSICIAN:  Maisie Fus A. Cornett, M.D.  REASON FOR CONSULTATION:  Melanoma.  HISTORY OF PRESENT ILLNESS:  This is a pleasant 56 year old woman of [place], [place].  Lived the majority of her life around that area.  Has had multiple occupations, currently unemployed.  She has a past medical history significant for breast cancer diagnosed in 2001. She had a left breast lesion.  She underwent a lumpectomy, followed by radiation, followed by 5 years of tamoxifen, and had been in remission since that time and really does not get any routine oncology followup at this time.  She had noted a lesion on her left forearm initially for many years and most recently for the last 3 months and that area appeared to become scaly and painful and was referred to Dr. Terri Piedra, Dermatology in April 2013.  Upon his evaluation, he noted a left forearm 3.5 x 1.2-cm erythematous scaly hyperkeratotic plaque in the right crease of the arm and based on that, the patient underwent a shave biopsy of the left forearm.  That is the case number [id number].  The pathology showed a superficial spreading melanoma.  Clark level was IV, the Breslow measurements of 1.5 mm.  There are 4 mitotic figures per high-power field, no regression, no ulceration, and the initial staging was stage T2a.  Based on that, the patient was referred for a wide excision by Dr. Luisa Hart.  That was performed on Mar 07, 2012, as well as a sentinel lymph node biopsy.  The pathology from that procedure, case number [id number], showed a residual superficial spreading melanoma, 1.9 mm in the greatest depth, tumor confined to the papillary dermis, which was Clark level II.  Margins are negative.  The lymph nodes of the left axilla, 2 sentinel lymph nodes were identified; none of them had any malignancy, indicating a clinical staging of T2a.  The patient had  a chest x-ray which showed no evidence of any acute pulmonary process at this time.  She recovered fairly well from her operation.  She still has a bandage on that area, but it appears to be healing at this time. Clinically she has not had any other symptoms.  Had not had any chest pain, had not had any shortness of breath, had not had any skin lesions. She has not had any skin cancer or any lesions removed in the past.  She does report chronic skin damage from sun exposure, multiple sunburns in the past.  REVIEW OF SYSTEMS:  Does not report any headaches, blurry vision, double vision.  Does not report any motor or sensory neuropathy.  Does not report any alteration in mental status.  Does not report any psychiatric issues or depression.  Does not report any fever, chills, sweats.  Does not report any cough, hemoptysis, hematemesis.  No nausea or vomiting. No abdominal pain, hematochezia, melena.  No genitourinary complaints. Rest of review of systems is unremarkable.  PAST MEDICAL HISTORY:  Significant for the following: 1. History of breast cancer.  Details are unavailable to me at this     point.  Apparently had surgery in [facility].  She had a     lumpectomy, followed by radiation.  She had an axillary lymph node     sampling versus dissection; it is unclear to me which is which.     She has never really had any left lymphedema.  She was treated with  5 years of tamoxifen.  She could not really tell me whether she has     seen an oncologist.  She knows that she has seen a radiation     oncologist.  If she has seen a medical oncologist, she is not sure     who that is.  She does get routine mammograms on a regular basis. 2. No other past medical history.  She does not have any history of     hypertension, diabetes, coronary disease.  MEDICATIONS:  She is currently on multivitamins.  PAST SURGICAL HISTORY:  She had the breast cancer as mentioned.  She had right leg  surgery.  SOCIAL HISTORY:  She smokes a few cigarettes a day, but used to smoke about 1 pack a day.  She denied any alcohol.  She has 2 children.  She is married.  ALLERGIES:  Allergic to penicillin and codeine.  FAMILY HISTORY:  Her father died of cardiac complications.  Mother had pulmonary complications.  Really no history of any breast cancer.  She had an uncle that had melanoma.  PHYSICAL EXAMINATION:  General:  Alert, awake female, appeared in no active distress.  Vital Signs:  Her blood pressure today is 120/76, pulse is 75, respirations 20.  She is afebrile.  HEENT:  Head is normocephalic, atraumatic.  Pupils equal, round, reactive to light. Oral mucosa moist and pink.  Neck:  Supple without lymphadenopathy. Heart:  Regular rate and rhythm, S1 and S2.  Lungs:  Clear to auscultation.  No rhonchi, wheezes, or dullness to percussion.  Abdomen: Soft, nontender.  No hepatosplenomegaly.  Extremities:  No edema. Lymphatic Examination:  I could not palpate any lymphadenopathy in the axillary, supraclavicular, or cervical area.  Skin Examination:  No evidence of any erythema or lesions.  She has a left wrist bandage in place.  LABORATORY DATA:  Showed a hemoglobin of 13, white cell count 6.9, platelet count 199.  Chemistries including comprehensive metabolic panel and LDH are currently pending.  ASSESSMENT AND PLAN:  This is a pleasant 56 year old woman with the following issues: 1. Recent diagnosis of superficial spreading melanoma.  Her final     staging is stage I with 1.9-mm Breslow measurements and Clark level     of II.  She has 2 sentinel lymph nodes that were negative.  I had a     lengthy discussion today discussing the natural course of melanoma,     specifically early stage and the treatment approach.  At this time,     due to the fact that it is an early stage, low-risk disease, no     further therapy is warranted.  No adjuvant therapy at this point is     indicated  given the fact that it is a low risk.  Her risk of     relapse at this time is probably 10% or probably less.  So there is     really no role for adjuvant immune therapy and unlikely any     clinical trial would enroll such early stage, low-risk disease.  In     terms of further management, she will definitely need active     surveillance not only to monitor for relapse for this particular     melanoma, but also screen her for future risk of melanoma.  For     that reason, she will need dermatological followup on at least an     annual basis.  Followup with me every 6 months.  We will do     clinical labs, as well as imaging studies if symptoms arise.  At     this point, I do not think there is any need for abdominal imaging.     She has had a chest x-ray that was negative.  Certainly if she has     any abnormalities in her labs or symptomatology, then imaging will     be done at that time.  I have also advised her about skin     protection from UV light that includes using SPF of 30 or higher,     wearing long sleeve shirts and hats to limit UV exposure at that     time. 2. Breast cancer.  Again, seems to be remote, appears to be in     remission at this time.  Continue to be monitored with annual     mammograms and self-breast examination.  I also reminded her to     continue to do self skin examination at this time. All her questions were answered.  I will see her back in 6 months' time and reiterate some of these findings from today.    ______________________________ Benjiman Core, M.D. FNS/MEDQ  D:  03/27/2012  T:  03/27/2012  Job:  [id number]

## 2012-03-27 NOTE — Progress Notes (Signed)
New patient today, patient accompanied by her husband, patient has no insurance, gave patient EPP financial application, patient stated she has already applied for medicaid and does not qualify. Patient husband is unemployed and is recieveing unemployment. She will get EPP application back to me as soon as possible.

## 2012-03-27 NOTE — Progress Notes (Signed)
Note dictated

## 2012-04-09 ENCOUNTER — Encounter (INDEPENDENT_AMBULATORY_CARE_PROVIDER_SITE_OTHER): Payer: Self-pay | Admitting: Surgery

## 2012-04-09 ENCOUNTER — Ambulatory Visit (INDEPENDENT_AMBULATORY_CARE_PROVIDER_SITE_OTHER): Payer: Self-pay | Admitting: Surgery

## 2012-04-09 VITALS — BP 128/72 | HR 81 | Temp 97.9°F | Resp 14 | Ht 68.0 in | Wt 200.0 lb

## 2012-04-09 DIAGNOSIS — M7989 Other specified soft tissue disorders: Secondary | ICD-10-CM

## 2012-04-09 NOTE — Progress Notes (Signed)
Patient returned in followup after excision of left arm melanoma. She is doing well. She has developed left arm and hand lymphedema.  Exam: Skin graft site had 85% take. No signs of infection. She is 2+ pitting edema left upper extremity.  Impression: Status post excision left forearm melanoma  Plan: Return in 6 weeks. Lymphedema clinic. Continue present wound care.

## 2012-04-09 NOTE — Patient Instructions (Addendum)
Lymphedema Lymphedema is a swelling caused by the abnormal collection of lymph under the skin. The lymph is fluid from the tissues in your body that travels in the lymphatic system. This system is part of the immune system that includes lymph nodes and vessels. The lymph vessels collect and carry the excess fluid, fats, proteins, and wastes from the tissues of the body to the bloodstream. This system also works to clean and remove bacteria and waste products from the body.  Lymphedema occurs when the lymphatic system is blocked. When the lymph vessels or lymph nodes are blocked or damaged, lymph does not drain properly. This causes abnormal build up of lymph. This leads to swelling in the arms or legs. Lymphedema cannot be cured by medicines. But the swelling can be reduced by physical methods. CAUSES  There are two types of Lymphedema. Primary lymphedema is caused by the absence or abnormality of the lymph vessel at birth. It is also known as inherited lymphedema, which occurs rarely. Secondary or acquired lymphedema occurs when the lymph vessel is damaged or blocked. The causes of lymph vessel blockage are:   Skin infection like cellulites.   Infection by parasites (filariasis).   Injury.   Cancer.   Radiation therapy.   Formation of scar tissue.   Surgery.  SYMPTOMS  The symptoms of lymphedema are:  Abnormal swelling of the arm or leg.   Heavy or tight feeling in your arm or leg.   Tight-fitting shoes or rings.   Redness of skin over the affected area.   Limited movement of the affected limb.   Some patients complain about sensitivity to touch and discomfort in the limb(s) affected.  You may not have these symptoms immediately following injury. They usually appear within a few days or even years after injury. Inform your caregiver, if you have any of these symptoms. Early treatment can avoid further problems.  DIAGNOSIS  First, your caregiver will inquire about any surgery you  have had or medicines you are taking. He will then examine you. Your caregiver may order special imaging tests, such as:  Lymphoscintigraphy (a test in which a low dose of radioactive substance is injected to trace the flow of lymph through the lymph vessels).   MRI (imaging tests using sound waves).   Computed tomography (test using special cross-sectional X-rays).   Duplex ultrasound (test using high-frequency sound waves to show the vessels and the blood flow on a screen).   Lymphangiography (special X-ray taken after injecting a contrast dye into the lymph vessel). It is now rarely done.  TREATMENT  Lymphedema can be treated in different ways. Your caregiver will decide the type of treatment depending on the cause. Treatment may include:  Exercise: Special exercises will help fluid move out easily from the affected part. This should be done as per your caregiver's advice.   Manual lymph drainage: Gentle massage of the affected limb makes the fluid to move out more freely.   Compression: Compression stockings or external pump apply pressure over the affected limb. This helps the fluid to move out from the arm or leg. Bandaging can also help to move the fluid out from the affected part.  Your caregiver will decide the method that suits you the most.   Medicines: Your caregiver may prescribe antibiotics, if you have infection.   Surgery: Your caregiver may advise surgery for severe lymphedema. It is reserved for special cases when the patient has difficulty moving. Your surgeon may remove excess tissue from   the arm or leg. This will help to ease your movement. Physical therapy may have to be continued after surgery.  HOME CARE INSTRUCTIONS   Eat a healthy diet.   Exercise regularly as per advice.   Keep the affected area clean and dry.   Use gloves while cooking or gardening.   Protect your skin from cuts.   Use electric razor to shave the affected area.   Keep affected limb  elevated.   Do not wear tight clothes, shoes, or jewels as it may cause the tissue to be strangled.   Do not use heat pads over the affected area.   Do not sit with cross legs.   Do not walk barefoot.   Do not carry weight on the affected arm.   Avoid having blood pressure checked on the affected limb.   The area is very fragile and is predisposed to injury and infection.  SEEK MEDICAL CARE IF:  You continue to have swelling in your limb. SEEK IMMEDIATE MEDICAL CARE IF:   You have high fever.   You have skin rash.   You have chills or sweats.   You have pain or redness.   You have a cut that does not heal.  MAKE SURE YOU:   Understand these instructions.   Will watch your condition.   Will get help right away if you are not doing well or get worse.  Document Released: 07/23/2007 Document Revised: 09/14/2011 Document Reviewed: 06/28/2009 [employer] Patient Information 2012 [employer]. 

## 2012-04-19 ENCOUNTER — Encounter: Payer: Self-pay | Admitting: Oncology

## 2012-04-19 ENCOUNTER — Telehealth: Payer: Self-pay | Admitting: Oncology

## 2012-04-19 NOTE — Telephone Encounter (Signed)
Patient called about bills returned patient phone call explained to patient that according to our records she got approved for 100% discount, patient can call billing office to speak with them about the discount and they will adjust the bill that she has. I also explained to patient that any bill that she gets with the [employer] logo will be taken care of she just needs to call billing office when she gets the bill.

## 2012-04-24 ENCOUNTER — Ambulatory Visit: Payer: Self-pay | Attending: Surgery | Admitting: Physical Therapy

## 2012-04-24 DIAGNOSIS — IMO0001 Reserved for inherently not codable concepts without codable children: Secondary | ICD-10-CM | POA: Insufficient documentation

## 2012-04-24 DIAGNOSIS — I890 Lymphedema, not elsewhere classified: Secondary | ICD-10-CM | POA: Insufficient documentation

## 2012-04-29 ENCOUNTER — Ambulatory Visit: Payer: Self-pay

## 2012-05-01 ENCOUNTER — Ambulatory Visit: Payer: Self-pay

## 2012-05-16 ENCOUNTER — Encounter (INDEPENDENT_AMBULATORY_CARE_PROVIDER_SITE_OTHER): Payer: Self-pay | Admitting: Surgery

## 2012-05-16 ENCOUNTER — Ambulatory Visit (INDEPENDENT_AMBULATORY_CARE_PROVIDER_SITE_OTHER): Payer: Self-pay | Admitting: Surgery

## 2012-05-16 VITALS — BP 116/70 | HR 68 | Temp 97.3°F | Resp 16 | Ht 68.0 in | Wt 199.0 lb

## 2012-05-16 DIAGNOSIS — Z9889 Other specified postprocedural states: Secondary | ICD-10-CM

## 2012-05-16 NOTE — Progress Notes (Signed)
Patient returns in followup after excision of stage I left forearm melanoma. She developed some left arm lymphedema and went to the lymphedema clinic. It is improved improved dramatically. She is able to use her arm at all he has to swelling of her fingers now. Denies any significant pain. She is seen medical oncology and no adjuvant therapy recommended.  Exam: Left arm skin graft site has healed 100%. There is some hypo-pigmentation. Lymphedema is much improved and only involves her fingertips. Range of motion excellent. Donor site and lymph node harvest site clean dry intact.  Impression: Stage I left arm melanoma status post wide excision, full thickness skin graft, and left axillary sentinel lymph node mapping  Plan: Return in 6 months. Resume full activity. Lymphedema clinic as necessary. Her history of breast cancer noted. She is no evidence of recurrence of that.

## 2012-05-16 NOTE — Patient Instructions (Signed)
Return 6 months

## 2012-09-24 ENCOUNTER — Telehealth: Payer: Self-pay | Admitting: Oncology

## 2012-09-24 ENCOUNTER — Other Ambulatory Visit (HOSPITAL_BASED_OUTPATIENT_CLINIC_OR_DEPARTMENT_OTHER): Payer: Self-pay | Admitting: Lab

## 2012-09-24 ENCOUNTER — Ambulatory Visit (HOSPITAL_BASED_OUTPATIENT_CLINIC_OR_DEPARTMENT_OTHER): Payer: Self-pay | Admitting: Oncology

## 2012-09-24 VITALS — BP 129/73 | HR 62 | Temp 97.0°F | Resp 18 | Ht 68.0 in | Wt 201.6 lb

## 2012-09-24 DIAGNOSIS — C439 Malignant melanoma of skin, unspecified: Secondary | ICD-10-CM

## 2012-09-24 DIAGNOSIS — Z853 Personal history of malignant neoplasm of breast: Secondary | ICD-10-CM

## 2012-09-24 LAB — COMPREHENSIVE METABOLIC PANEL (CC13)
ALT: 22 U/L (ref 0–55)
Albumin: 3.5 g/dL (ref 3.5–5.0)
Alkaline Phosphatase: 72 U/L (ref 40–150)
Glucose: 108 mg/dl — ABNORMAL HIGH (ref 70–99)
Potassium: 4.4 mEq/L (ref 3.5–5.1)
Sodium: 141 mEq/L (ref 136–145)
Total Bilirubin: 0.42 mg/dL (ref 0.20–1.20)
Total Protein: 6.7 g/dL (ref 6.4–8.3)

## 2012-09-24 LAB — CBC WITH DIFFERENTIAL/PLATELET
BASO%: 0.8 % (ref 0.0–2.0)
Basophils Absolute: 0.1 10*3/uL (ref 0.0–0.1)
HCT: 37.1 % (ref 34.8–46.6)
HGB: 12.5 g/dL (ref 11.6–15.9)
MCH: 29.5 pg (ref 25.1–34.0)
MCV: 87.7 fL (ref 79.5–101.0)
MONO%: 7.9 % (ref 0.0–14.0)
NEUT%: 57.3 % (ref 38.4–76.8)
Platelets: 185 10*3/uL (ref 145–400)
lymph#: 2.2 10*3/uL (ref 0.9–3.3)

## 2012-09-24 NOTE — Telephone Encounter (Signed)
gv and printed appt schedule for pt for June 2014...the patient aware to go to radiology for x-ray b4 visit.

## 2012-09-24 NOTE — Progress Notes (Signed)
Hematology and Oncology Follow Up Visit  Kaitlyn Martinez [id number] [birth year] 56 y.o. 09/24/2012 10:37 AM    Principle Diagnosis: 56 year old with superficial spreading melanoma stage I with 1.9-mm Breslow measurements and Clark level  of II. Diagnosed 01/2012.  Prior Therapy: She is S/P a wide excision by Dr. Luisa Hart. That was performed on Mar 07, 2012, as well as a sentinel lymph node biopsy. The pathology from that procedure, case number [id number], showed a residual superficial spreading melanoma, 1.9  mm in the greatest depth, tumor confined to the papillary dermis, which was Clark level II. Margins are negative. The lymph nodes of the left axilla, 2 sentinel lymph nodes were identified; none of them had any malignancy.   Current therapy: Observation and follow up.   Interim History: Kaitlyn Martinez presents today for a follow up visit. She is a very nice women with the above issues. Since her last visit, she has been doing well at this time. Clinically she has not had any other symptoms. Had not had any chest pain, had not had any shortness of breath, had not had any skin lesions. She has not had any skin cancer or any lesions removed in the past. She continues to follow with her Dermatologist.   Medications: I have reviewed the patient's current medications. Current outpatient prescriptions:cetirizine (ZYRTEC) 10 MG chewable tablet, Chew 10 mg by mouth daily., Disp: , Rfl: ;  ibuprofen (ADVIL,MOTRIN) 200 MG tablet, Take 200 mg by mouth every 6 (six) hours as needed., Disp: , Rfl: ;  Multiple Vitamin (MULTIVITAMIN) tablet, Take 1 tablet by mouth daily., Disp: , Rfl:   Allergies:  Allergies  Allergen Reactions  . Codeine   . Penicillins     Past Medical History, Surgical history, Social history, and Family History were reviewed and updated.  Review of Systems: Constitutional:  Negative for fever, chills, night sweats, anorexia, weight loss, pain. Cardiovascular: no chest pain or  dyspnea on exertion Respiratory: negative Neurological: negative Dermatological: negative ENT: negative Skin: Negative. Gastrointestinal: negative Genito-Urinary: negative Hematological and Lymphatic: negative Breast: negative Musculoskeletal: negative Remaining ROS negative. Physical Exam: Blood pressure 129/73, pulse 62, temperature 97 F (36.1 C), temperature source Oral, resp. rate 18, height 5\' 8"  (1.727 m), weight 201 lb 9.6 oz (91.445 kg). ECOG: 1 General appearance: alert Head: Normocephalic, without obvious abnormality, atraumatic Neck: no adenopathy, no carotid bruit, no JVD, supple, symmetrical, trachea midline and thyroid not enlarged, symmetric, no tenderness/mass/nodules Lymph nodes: Cervical, supraclavicular, and axillary nodes normal. Heart:regular rate and rhythm, S1, S2 normal, no murmur, click, rub or gallop Lung:chest clear, no wheezing, rales, normal symmetric air entry Abdomin: soft, non-tender, without masses or organomegaly EXT:no erythema, induration, or nodules   Lab Results: Lab Results  Component Value Date   WBC 6.9 09/24/2012   HGB 12.5 09/24/2012   HCT 37.1 09/24/2012   MCV 87.7 09/24/2012   PLT 185 09/24/2012     Chemistry      Component Value Date/Time   NA 141 03/27/2012 1027   K 4.0 03/27/2012 1027   CL 107 03/27/2012 1027   CO2 25 03/27/2012 1027   BUN 10 03/27/2012 1027   CREATININE 0.76 03/27/2012 1027      Component Value Date/Time   CALCIUM 9.4 03/27/2012 1027   ALKPHOS 79 03/27/2012 1027   AST 17 03/27/2012 1027   ALT 23 03/27/2012 1027   BILITOT 0.2* 03/27/2012 1027      Impression and Plan:  This is a pleasant 56 year old woman with  the  following issues:  1. Recent diagnosis of superficial spreading melanoma. Her final staging is stage I with 1.9-mm Breslow measurements and Clark level of II. She has 2 sentinel lymph nodes that were negative. For now, we will continue active follow up every 6 months with exam, labs and CXR  every 12 months.    2. Breast cancer. Again, seems to be remote, appears to be in remission at this time. Continue to be monitored with annual  mammograms and self-breast examination.    Ledell Codrington, MD 12/17/201310:37 AM

## 2012-10-18 ENCOUNTER — Encounter (HOSPITAL_COMMUNITY): Payer: Self-pay | Admitting: Emergency Medicine

## 2012-10-18 ENCOUNTER — Emergency Department (HOSPITAL_COMMUNITY)
Admission: EM | Admit: 2012-10-18 | Discharge: 2012-10-19 | Disposition: A | Discharge status: Home / Self Care | Payer: Self-pay | Attending: Emergency Medicine | Admitting: Emergency Medicine

## 2012-10-18 DIAGNOSIS — K1120 Sialoadenitis, unspecified: Secondary | ICD-10-CM | POA: Insufficient documentation

## 2012-10-18 DIAGNOSIS — Z79899 Other long term (current) drug therapy: Secondary | ICD-10-CM | POA: Insufficient documentation

## 2012-10-18 DIAGNOSIS — F17200 Nicotine dependence, unspecified, uncomplicated: Secondary | ICD-10-CM | POA: Insufficient documentation

## 2012-10-18 DIAGNOSIS — Z85820 Personal history of malignant melanoma of skin: Secondary | ICD-10-CM | POA: Insufficient documentation

## 2012-10-18 DIAGNOSIS — Z9889 Other specified postprocedural states: Secondary | ICD-10-CM | POA: Insufficient documentation

## 2012-10-18 DIAGNOSIS — Z853 Personal history of malignant neoplasm of breast: Secondary | ICD-10-CM | POA: Insufficient documentation

## 2012-10-18 DIAGNOSIS — R51 Headache: Secondary | ICD-10-CM | POA: Insufficient documentation

## 2012-10-18 HISTORY — DX: Malignant melanoma of skin, unspecified: C43.9

## 2012-10-18 NOTE — ED Notes (Signed)
Pt reports swelling to L side of neck near L ear. Pt states swelling is getting worse since she has been here. Pt reports that she had a tooth pulled in mid December on that same side. Pt with no acute distress. Pt states it is getting harder for her to swallow due to the swelling.

## 2012-10-18 NOTE — ED Notes (Signed)
Pt c/o swollen area around L ear, onset yesterday.

## 2012-10-19 ENCOUNTER — Emergency Department (HOSPITAL_COMMUNITY): Payer: Self-pay

## 2012-10-19 LAB — CBC WITH DIFFERENTIAL/PLATELET
Eosinophils Relative: 1 % (ref 0–5)
HCT: 37 % (ref 36.0–46.0)
Lymphocytes Relative: 16 % (ref 12–46)
Lymphs Abs: 2.3 10*3/uL (ref 0.7–4.0)
MCV: 86.4 fL (ref 78.0–100.0)
Monocytes Absolute: 1.5 10*3/uL — ABNORMAL HIGH (ref 0.1–1.0)
Monocytes Relative: 11 % (ref 3–12)
RBC: 4.28 MIL/uL (ref 3.87–5.11)
WBC: 14.5 10*3/uL — ABNORMAL HIGH (ref 4.0–10.5)

## 2012-10-19 LAB — BASIC METABOLIC PANEL
CO2: 21 mEq/L (ref 19–32)
Calcium: 9.5 mg/dL (ref 8.4–10.5)
Creatinine, Ser: 0.67 mg/dL (ref 0.50–1.10)
Glucose, Bld: 116 mg/dL — ABNORMAL HIGH (ref 70–99)

## 2012-10-19 MED ORDER — HYDROMORPHONE HCL PF 1 MG/ML IJ SOLN
1.0000 mg | Freq: Once | INTRAMUSCULAR | Status: AC
  Administered 2012-10-19: 1 mg via INTRAVENOUS
  Filled 2012-10-19: qty 1

## 2012-10-19 MED ORDER — OXYCODONE-ACETAMINOPHEN 5-325 MG PO TABS: 2.0000 | ORAL_TABLET | ORAL | Status: DC | PRN

## 2012-10-19 MED ORDER — AMOXICILLIN-POT CLAVULANATE 875-125 MG PO TABS: 1.0000 | ORAL_TABLET | Freq: Two times a day (BID) | ORAL | Status: DC

## 2012-10-19 MED ORDER — HYDROMORPHONE HCL PF 1 MG/ML IJ SOLN
0.5000 mg | Freq: Once | INTRAMUSCULAR | Status: AC
  Administered 2012-10-19: 0.5 mg via INTRAVENOUS
  Filled 2012-10-19: qty 1

## 2012-10-19 MED ORDER — IOHEXOL 300 MG/ML  SOLN
100.0000 mL | Freq: Once | INTRAMUSCULAR | Status: AC | PRN
  Administered 2012-10-19: 100 mL via INTRAVENOUS

## 2012-10-19 MED ORDER — OXYCODONE-ACETAMINOPHEN 5-325 MG PO TABS
1.0000 | ORAL_TABLET | Freq: Once | ORAL | Status: AC
  Administered 2012-10-19: 1 via ORAL
  Filled 2012-10-19: qty 1

## 2012-10-19 NOTE — ED Notes (Signed)
Spoke with lab re BMP results. They advise to expect results in 10 min

## 2012-10-19 NOTE — ED Notes (Signed)
Pt in CT at this time.

## 2012-10-19 NOTE — ED Provider Notes (Signed)
History     CSN: [id number]  Arrival date & time 10/18/12  2245   First MD Initiated Contact with Patient 10/18/12 2346      Chief Complaint  Patient presents with  . Facial Swelling    (Consider location/radiation/quality/duration/timing/severity/associated sxs/prior treatment) HPI History provided by pt.   Pt had a left upper dental extraction approx 1 week ago.  Presents today w/ severe pain, edema and erythema of left cheek.  Pain radiates into her ear and left side of neck.  Denies fever, dental pain, dysphagia.  No trauma.    Past Medical History  Diagnosis Date  . Cancer 2000    breast  . Melanoma     Past Surgical History  Procedure Date  . Lumpectomy   . Leg surgery     No family history on file.  History  Substance Use Topics  . Smoking status: Current Every Day Smoker  . Smokeless tobacco: Not on file  . Alcohol Use: Yes     Comment: rarely    OB History    Grav Para Term Preterm Abortions TAB SAB Ect Mult Living                  Review of Systems  All other systems reviewed and are negative.    Allergies  Codeine and Penicillins  Home Medications   Current Outpatient Rx  Name  Route  Sig  Dispense  Refill  . IBUPROFEN 200 MG PO TABS   Oral   Take 800 mg by mouth every 6 (six) hours as needed.         . ADULT MULTIVITAMIN W/MINERALS CH   Oral   Take 1 tablet by mouth every morning.         Marland Kitchen AMOXICILLIN-POT CLAVULANATE 875-125 MG PO TABS   Oral   Take 1 tablet by mouth every 12 (twelve) hours.   14 tablet   0   . OXYCODONE-ACETAMINOPHEN 5-325 MG PO TABS   Oral   Take 2 tablets by mouth every 4 (four) hours as needed for pain.   20 tablet   0     BP 95/53  Pulse 71  Temp 98.5 F (36.9 C) (Oral)  Resp 18  Ht 5\' 8"  (1.727 m)  Wt 200 lb (90.719 kg)  BMI 30.41 kg/m2  SpO2 94%  Physical Exam  Nursing note and vitals reviewed. Constitutional: She is oriented to person, place, and time. She appears well-developed and  well-nourished. No distress.  HENT:  Head: Normocephalic and atraumatic.       Preauricular induration, edema, erythema and tendenress that spreads down to angle of jaw.  Nml external ear and external auditory canal as well as mastoid.  No trismus but opening mouth painful.  No palpable submandibular gland stone.    Eyes:       Normal appearance  Neck: Normal range of motion.  Cardiovascular: Normal rate and regular rhythm.   Pulmonary/Chest: Effort normal and breath sounds normal. No respiratory distress.  Musculoskeletal: Normal range of motion.  Lymphadenopathy:    She has no cervical adenopathy.  Neurological: She is alert and oriented to person, place, and time.  Skin: Skin is warm and dry. No rash noted.  Psychiatric: She has a normal mood and affect. Her behavior is normal.    ED Course  Procedures (including critical care time)  Labs Reviewed  CBC WITH DIFFERENTIAL - Abnormal; Notable for the following:    WBC 14.5 (*)  Neutro Abs 10.4 (*)     Monocytes Absolute 1.5 (*)     All other components within normal limits  BASIC METABOLIC PANEL - Abnormal; Notable for the following:    Glucose, Bld 116 (*)     All other components within normal limits   Dg Neck Soft Tissue  10/19/2012  *RADIOLOGY REPORT*  Clinical Data: Facial swelling, fever.  NECK SOFT TISSUES - 1+ VIEW  Comparison: None.  Findings: Epiglottis and aryepiglottic folds are normal. Retropharyngeal soft tissues normal.  Airway is patent.  No acute bony abnormality.  IMPRESSION: Unremarkable study.   Original Report Authenticated By: Charlett Nose, M.D.    Ct Maxillofacial W/cm  10/19/2012  *RADIOLOGY REPORT*  Clinical Data: Left facial swelling.  Recent tooth extraction.  CT MAXILLOFACIAL WITH CONTRAST  Technique:  Multidetector CT imaging of the maxillofacial structures was performed with intravenous contrast. Multiplanar CT image reconstructions were also generated.  Contrast: OMNIPAQUE IOHEXOL 300 MG/ML   SOLN  Comparison: None.  Findings: There is enlargement of the left parotid gland with heterogeneous enhancement.  Surrounding inflammatory change/stranding.  Findings compatible with parotitis.  No focal fluid collection to suggest abscess.  Left submandibular gland is enlarged relative to the right with mild enhancement.  Findings compatible with sialadenitis, either primary or reactive.  Mildly prominent, reactive cervical lymph nodes bilaterally.  Airway is patent.  Paranasal sinuses are clear.  Orbital soft tissues are unremarkable.  No acute bony abnormality.  IMPRESSION: Enlargement, enhancement and surrounding inflammatory change about the left parotid gland compatible with parotitis.  Left submandibular gland is also enlarged and enhancing compatible with sialadenitis, either primary or reactive.  No evidence of abscess.   Original Report Authenticated By: Charlett Nose, M.D.      1. Parotitis       MDM   [phone] F presents w/ non-traumatic L facial edema, erythema, induration and pain.  Recent dental extraction.  CT maxillofacial shows parotitis w/out abscess and sialoadenitis.  Discussed w/ Dr. Dierdre Highman who recommends augmentin and analgesic.  I recommended sucking on sour candy as well.  Referred to ENT.  Return precautions discussed.        Otilio Miu, PA-C 10/19/12 [phone]

## 2012-10-20 NOTE — ED Provider Notes (Signed)
Medical screening examination/treatment/procedure(s) were performed by non-physician practitioner and as supervising physician I was immediately available for consultation/collaboration.  Sunnie Nielsen, MD 10/20/12 [phone]

## 2012-10-25 ENCOUNTER — Encounter (INDEPENDENT_AMBULATORY_CARE_PROVIDER_SITE_OTHER): Payer: Self-pay | Admitting: Surgery

## 2013-03-17 ENCOUNTER — Ambulatory Visit (HOSPITAL_COMMUNITY)
Admission: RE | Admit: 2013-03-17 | Discharge: 2013-03-17 | Disposition: A | Discharge status: Home / Self Care | Payer: Self-pay | Source: Ambulatory Visit | Attending: Oncology | Admitting: Oncology

## 2013-03-17 ENCOUNTER — Other Ambulatory Visit: Payer: Self-pay | Admitting: Oncology

## 2013-03-17 DIAGNOSIS — R059 Cough, unspecified: Secondary | ICD-10-CM | POA: Insufficient documentation

## 2013-03-17 DIAGNOSIS — R05 Cough: Secondary | ICD-10-CM | POA: Insufficient documentation

## 2013-03-17 DIAGNOSIS — F17200 Nicotine dependence, unspecified, uncomplicated: Secondary | ICD-10-CM | POA: Insufficient documentation

## 2013-03-17 DIAGNOSIS — C439 Malignant melanoma of skin, unspecified: Secondary | ICD-10-CM

## 2013-03-18 ENCOUNTER — Other Ambulatory Visit (HOSPITAL_BASED_OUTPATIENT_CLINIC_OR_DEPARTMENT_OTHER): Payer: Self-pay | Admitting: Lab

## 2013-03-18 ENCOUNTER — Ambulatory Visit (HOSPITAL_BASED_OUTPATIENT_CLINIC_OR_DEPARTMENT_OTHER): Payer: Self-pay | Admitting: Oncology

## 2013-03-18 ENCOUNTER — Telehealth: Payer: Self-pay | Admitting: Oncology

## 2013-03-18 VITALS — BP 113/68 | HR 69 | Temp 97.3°F | Resp 18 | Ht 68.0 in | Wt 198.7 lb

## 2013-03-18 DIAGNOSIS — C439 Malignant melanoma of skin, unspecified: Secondary | ICD-10-CM

## 2013-03-18 LAB — CBC WITH DIFFERENTIAL/PLATELET
BASO%: 1.5 % (ref 0.0–2.0)
EOS%: 1.9 % (ref 0.0–7.0)
Eosinophils Absolute: 0.1 10*3/uL (ref 0.0–0.5)
LYMPH%: 28.8 % (ref 14.0–49.7)
MCH: 29.5 pg (ref 25.1–34.0)
MCHC: 34.4 g/dL (ref 31.5–36.0)
MCV: 85.8 fL (ref 79.5–101.0)
MONO%: 7.3 % (ref 0.0–14.0)
NEUT#: 4.5 10*3/uL (ref 1.5–6.5)
Platelets: 188 10*3/uL (ref 145–400)
RBC: 4.47 10*6/uL (ref 3.70–5.45)
RDW: 13.2 % (ref 11.2–14.5)

## 2013-03-18 LAB — COMPREHENSIVE METABOLIC PANEL (CC13)
ALT: 25 U/L (ref 0–55)
AST: 17 U/L (ref 5–34)
Albumin: 3.6 g/dL (ref 3.5–5.0)
Alkaline Phosphatase: 78 U/L (ref 40–150)
Glucose: 94 mg/dl (ref 70–99)
Potassium: 4.1 mEq/L (ref 3.5–5.1)
Sodium: 141 mEq/L (ref 136–145)
Total Bilirubin: 0.4 mg/dL (ref 0.20–1.20)
Total Protein: 7.2 g/dL (ref 6.4–8.3)

## 2013-03-18 NOTE — Progress Notes (Signed)
Hematology and Oncology Follow Up Visit  Kaitlyn Martinez [id number] 03-01-1956 57 y.o. 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 Diagnosed 01/2012.  Prior Therapy: She is S/P a wide excision by Dr. Luisa Hart. That was performed on Mar 07, 2012, as well as a sentinel lymph node biopsy. The pathology from that procedure, case number [id number], showed a residual superficial spreading melanoma, 1.9 mm in the greatest depth, tumor confined to the papillary dermis, which was Clark level II. Margins are negative. The lymph nodes of the left axilla, 2 sentinel lymph nodes wereidentified; none of them had any malignancy.   Current therapy: Observation and follow up.   Interim History: [name] presents today for a follow up visit. She is a very nice women with the above issues. Since her last visit, she has been doing well at this time. Clinically she has not had any other symptoms. Had not had any chest pain, had not had any shortness of breath, had not had any skin lesions. She has not had any skin cancer or any lesions removed in the past. She continues to follow with her Dermatologist. She is continuing to protect her skin with sun block and wearing hats.   Medications: I have reviewed the patient's current medications. Current Outpatient Prescriptions  Medication Sig Dispense Refill  . cetirizine (ZYRTEC) 10 MG chewable tablet Chew 10 mg by mouth daily.      Marland Kitchen ibuprofen (ADVIL,MOTRIN) 200 MG tablet Take 200 mg by mouth every 6 (six) hours as needed.      . Multiple Vitamin (MULTIVITAMIN) tablet Take 1 tablet by mouth daily.       No current facility-administered medications for this visit.    Allergies:  Allergies  Allergen Reactions  . Codeine   . Penicillins     Past Medical History, Surgical history, Social history, and Family History were reviewed and  updated.  Review of Systems: Constitutional:  Negative for fever, chills, night sweats, anorexia, weight loss, pain. Cardiovascular: no chest pain or dyspnea on exertion Respiratory: negative Neurological: negative Dermatological: negative ENT: negative Skin: Negative. Gastrointestinal: negative Genito-Urinary: negative Hematological and Lymphatic: negative Breast: negative Musculoskeletal: negative Remaining ROS negative. Physical Exam: Blood pressure 113/68, pulse 69, temperature 97.3 F (36.3 C), temperature source Oral, resp. rate 18, height 5\' 8"  (1.727 m), weight 198 lb 11.2 oz (90.13 kg). ECOG: 1 General appearance: alert Head: Normocephalic, without obvious abnormality, atraumatic Neck: no adenopathy, no carotid bruit, no JVD, supple, symmetrical, trachea midline and thyroid not enlarged, symmetric, no tenderness/mass/nodules Lymph nodes: Cervical, supraclavicular, and axillary nodes normal. Heart:regular rate and rhythm, S1, S2 normal, no murmur, click, rub or gallop Lung:chest clear, no wheezing, rales, normal symmetric air entry Abdomin: soft, non-tender, without masses or organomegaly EXT:no erythema, induration, or nodules   Lab Results: Lab Results  Component Value Date   WBC 7.4 03/18/2013   HGB 13.2 03/18/2013   HCT 38.3 03/18/2013   MCV 85.8 03/18/2013   PLT 188 03/18/2013     Chemistry      Component Value Date/Time   NA 141 03/18/2013 0941   NA 141 03/27/2012 1027   K 4.1 03/18/2013 0941   K 4.0 03/27/2012 1027   CL 107 03/18/2013 0941   CL 107 03/27/2012 1027   CO2 25 03/18/2013 0941   CO2 25 03/27/2012 1027   BUN 11.6 03/18/2013 0941   BUN 10  03/27/2012 1027   CREATININE 0.9 03/18/2013 0941   CREATININE 0.76 03/27/2012 1027      Component Value Date/Time   CALCIUM 9.7 03/18/2013 0941   CALCIUM 9.4 03/27/2012 1027   ALKPHOS 78 03/18/2013 0941   ALKPHOS 79 03/27/2012 1027   AST 17 03/18/2013 0941   AST 17 03/27/2012 1027   ALT 25 03/18/2013 0941   ALT 23  03/27/2012 1027   BILITOT 0.40 03/18/2013 0941   BILITOT 0.2* 03/27/2012 1027     Clinical Data: Melanoma of the skin. Cough. Smoker.  CHEST - 2 VIEW  Comparison: 03/06/2012  Findings: The heart size and mediastinal contours are within normal  limits. Both lungs are clear. The visualized skeletal structures  are unremarkable.  IMPRESSION:  Negative exam.  Impression and Plan:  This is a pleasant 57 year old woman with the  following issues:  1. Superficial spreading melanoma. Her final staging is stage I with 1.9-mm Breslow measurements and Clark level of II. She has 2 sentinel lymph nodes that were negative. Her exam, labs and CXR showed no abnormalities. For now, we will continue active follow up every 6 months with exam, labs and CXR every 12 months.   2. Breast cancer. Again, seems to be remote, appears to be in remission at this time. Continue to be monitored with annual  mammograms and self-breast examination.    Eli Hose, MD 6/10/201410:32 AM

## 2013-06-17 IMAGING — CT CT MAXILLOFACIAL W/ CM
1 series · 15 of 30 positions shown, 19 images · IV contrast (OMNIPAQUE 300)
Comparison: None.

CLINICAL DATA: Left facial swelling.  Recent tooth extraction.

CT MAXILLOFACIAL WITH CONTRAST
TECHNIQUE: Multidetector CT imaging of the maxillofacial
structures was performed with intravenous contrast. Multiplanar CT
image reconstructions were also generated.
Contrast: 100mL OMNIPAQUE IOHEXOL 300 MG/ML  SOLN

[Series 3: facial st · axial · 0.37mm/px · z∈[+54,+218]mm · 15 of 90 slices shown, 19 images]
[im 4/90  brain]
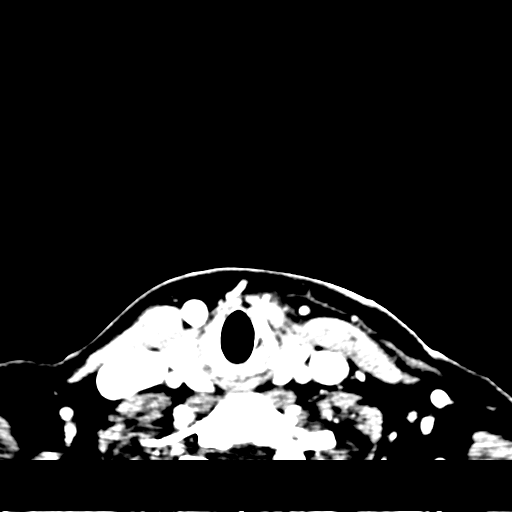
[im 4/90  bone]
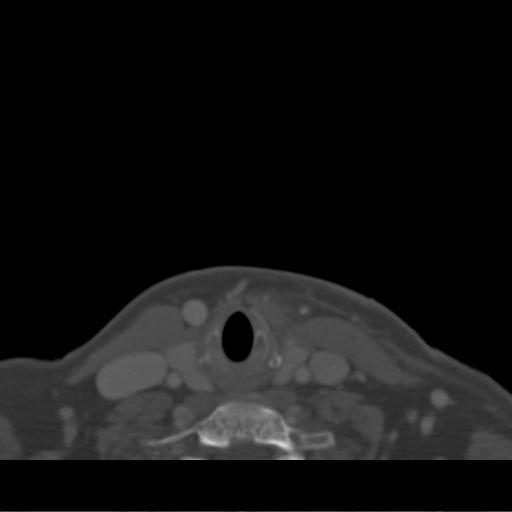
[im 10/90  bone]
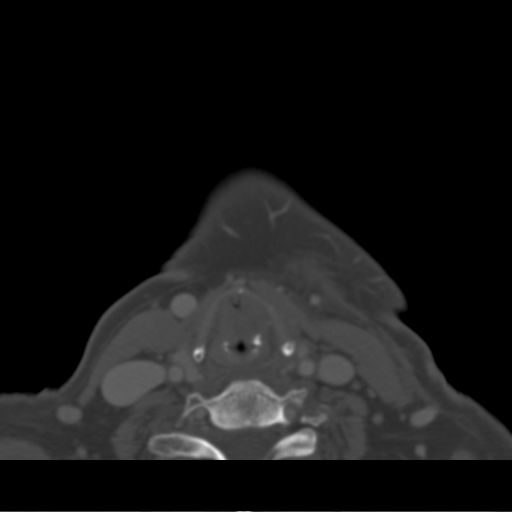
[im 16/90  bone]
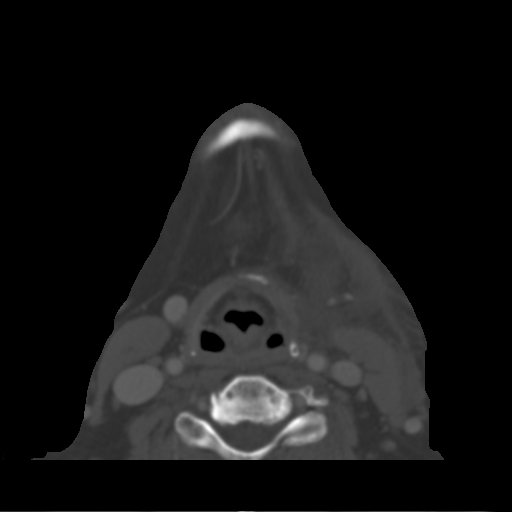
[im 22/90  bone]
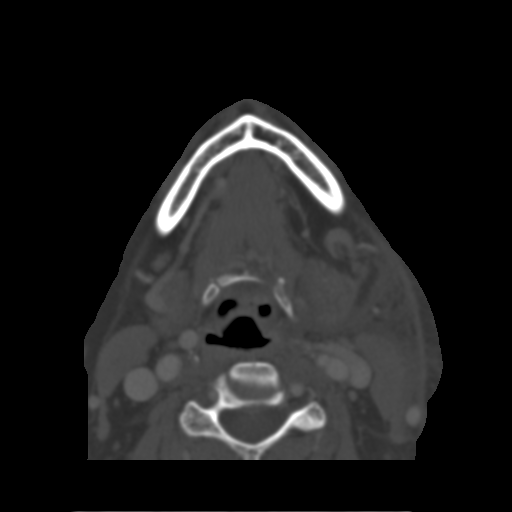
[im 28/90  brain]
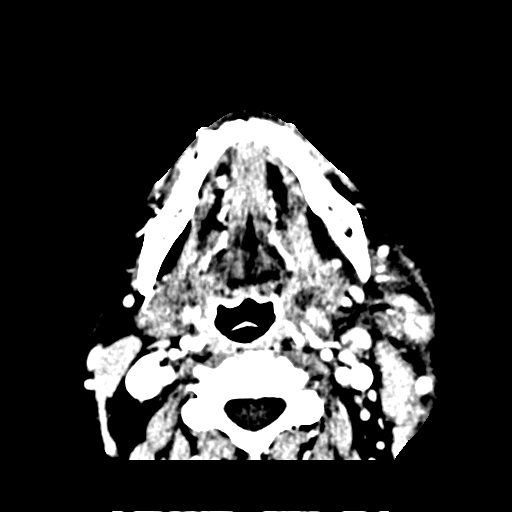
[im 28/90  bone]
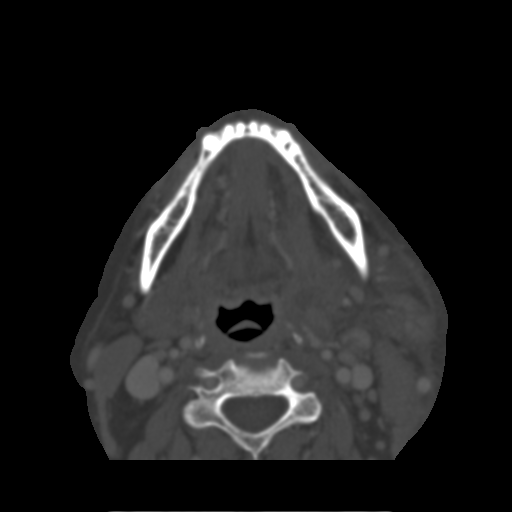
[im 34/90  bone]
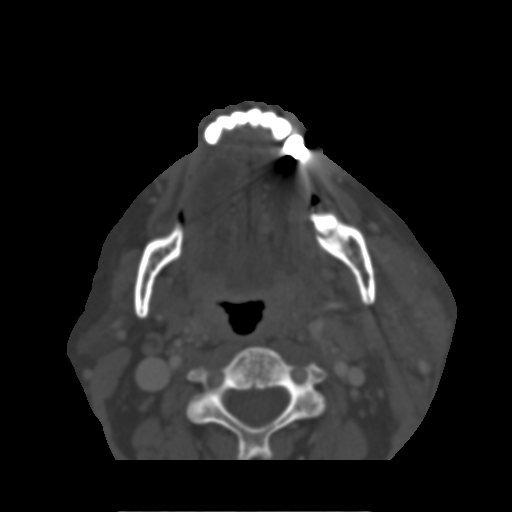
[im 40/90  bone]
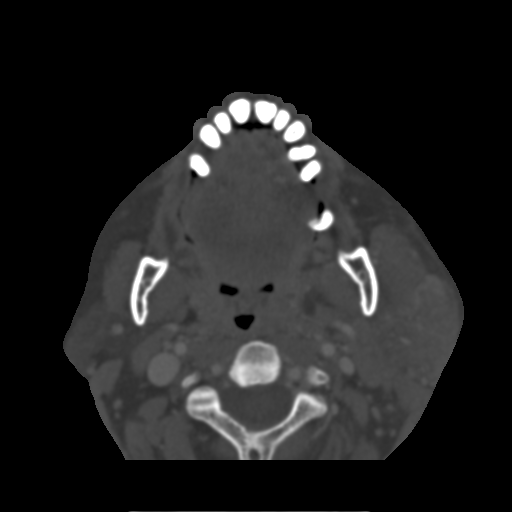
[im 47/90  bone]
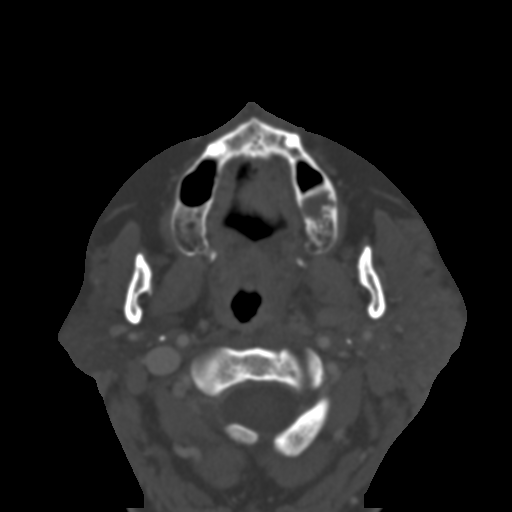
[im 50/90  brain]
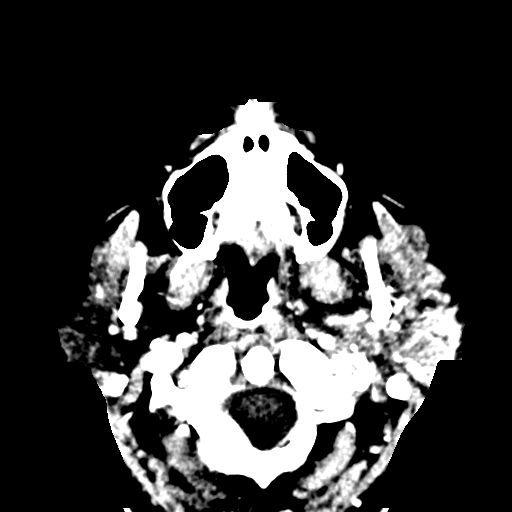
[im 50/90  bone]
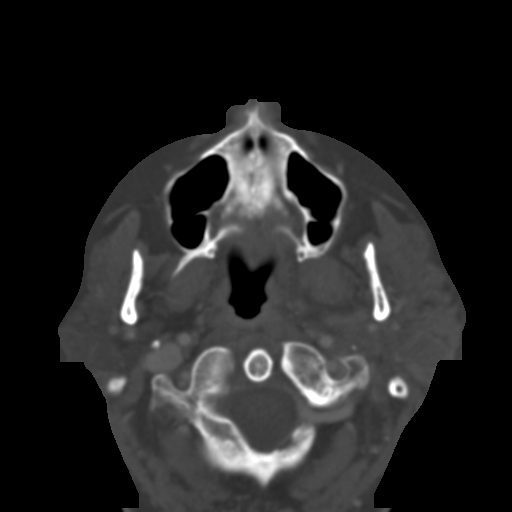
[im 56/90  bone]
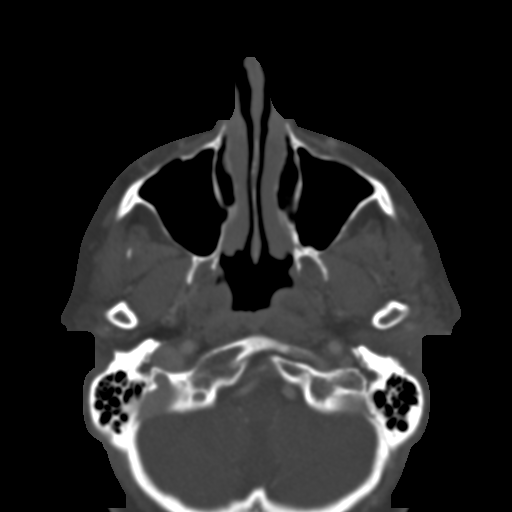
[im 62/90  bone]
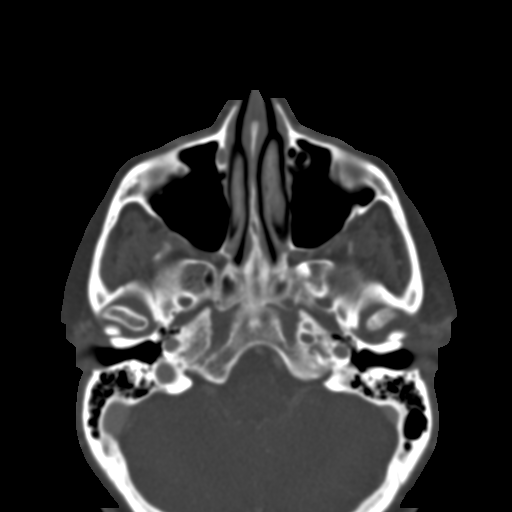
[im 68/90  bone]
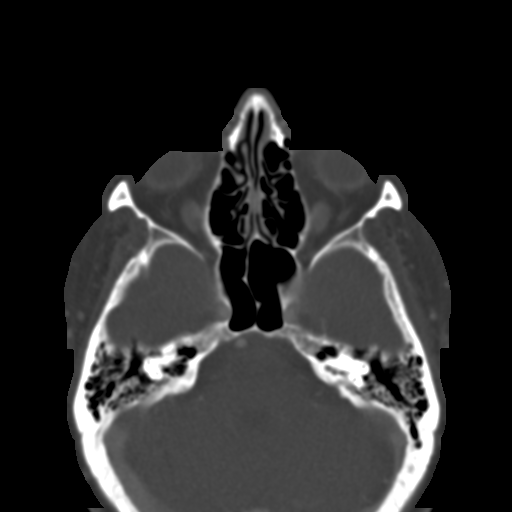
[im 74/90  brain]
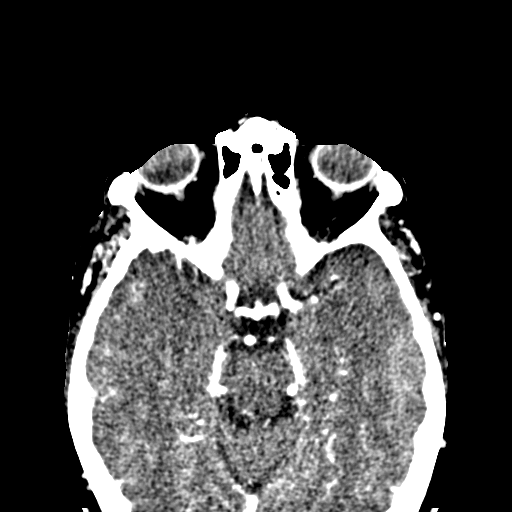
[im 74/90  bone]
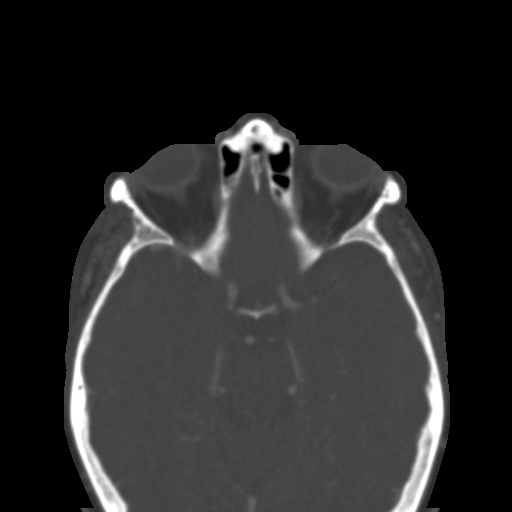
[im 80/90  bone]
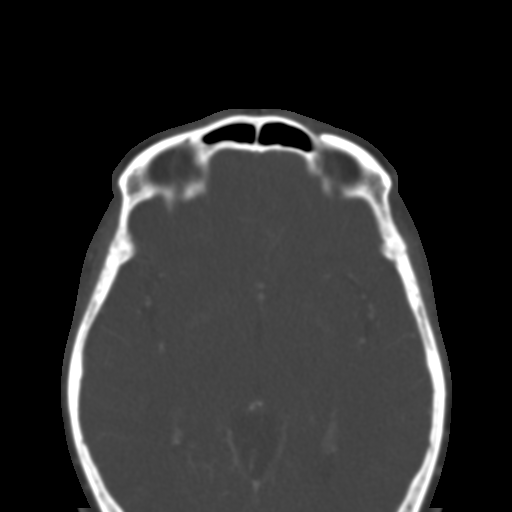
[im 86/90  bone]
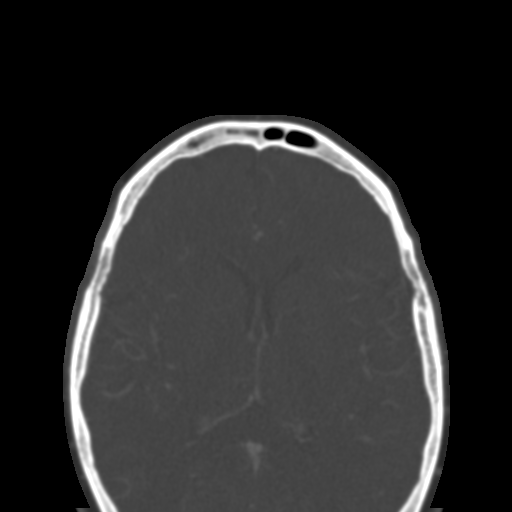

[15 of 30 positions shown; findings below may reference images not displayed]

FINDINGS: There is enlargement of the left parotid gland with
heterogeneous enhancement.  Surrounding inflammatory
change/stranding.  Findings compatible with parotitis.  No focal
fluid collection to suggest abscess.  Left submandibular gland is
enlarged relative to the right with mild enhancement.  Findings
compatible with sialadenitis, either primary or reactive.  Mildly
prominent, reactive cervical lymph nodes bilaterally.  Airway is
patent.  Paranasal sinuses are clear.  Orbital soft tissues are
unremarkable.  No acute bony abnormality.
IMPRESSION: Enlargement, enhancement and surrounding inflammatory change about
the left parotid gland compatible with parotitis.

Left submandibular gland is also enlarged and enhancing compatible
with sialadenitis, either primary or reactive.

No evidence of abscess.

## 2013-09-17 ENCOUNTER — Other Ambulatory Visit (HOSPITAL_BASED_OUTPATIENT_CLINIC_OR_DEPARTMENT_OTHER): Payer: Self-pay

## 2013-09-17 ENCOUNTER — Ambulatory Visit (HOSPITAL_BASED_OUTPATIENT_CLINIC_OR_DEPARTMENT_OTHER): Payer: Self-pay | Admitting: Oncology

## 2013-09-17 VITALS — BP 134/74 | HR 74 | Temp 97.9°F | Resp 18 | Ht 68.0 in | Wt 190.3 lb

## 2013-09-17 DIAGNOSIS — C4360 Malignant melanoma of unspecified upper limb, including shoulder: Secondary | ICD-10-CM

## 2013-09-17 DIAGNOSIS — Z853 Personal history of malignant neoplasm of breast: Secondary | ICD-10-CM

## 2013-09-17 DIAGNOSIS — C439 Malignant melanoma of skin, unspecified: Secondary | ICD-10-CM

## 2013-09-17 LAB — COMPREHENSIVE METABOLIC PANEL (CC13)
AST: 18 U/L (ref 5–34)
Alkaline Phosphatase: 83 U/L (ref 40–150)
BUN: 13 mg/dL (ref 7.0–26.0)
Glucose: 94 mg/dl (ref 70–140)
Potassium: 4.5 mEq/L (ref 3.5–5.1)
Sodium: 140 mEq/L (ref 136–145)
Total Bilirubin: 0.47 mg/dL (ref 0.20–1.20)

## 2013-09-17 LAB — CBC WITH DIFFERENTIAL/PLATELET
EOS%: 3.4 % (ref 0.0–7.0)
Eosinophils Absolute: 0.3 10*3/uL (ref 0.0–0.5)
LYMPH%: 31.7 % (ref 14.0–49.7)
MCH: 29.4 pg (ref 25.1–34.0)
MCV: 87.8 fL (ref 79.5–101.0)
MONO%: 8 % (ref 0.0–14.0)
NEUT#: 4.2 10*3/uL (ref 1.5–6.5)
Platelets: 214 10*3/uL (ref 145–400)
RBC: 4.45 10*6/uL (ref 3.70–5.45)
RDW: 13.1 % (ref 11.2–14.5)

## 2013-09-17 NOTE — Progress Notes (Signed)
Hematology and Oncology Follow Up Visit  Kaitlyn Martinez [id number] [birth year] 57 y.o. 09/17/2013 9:36 AM    Principle Diagnosis: 57 year old with superficial spreading melanoma stage I with 1.9-mm Breslow measurements and Clark level  of II. Diagnosed 01/2012.  Prior Therapy: She is S/P a wide excision by Dr. Luisa Martinez. That was performed on Mar 07, 2012, as well as a sentinel lymph node biopsy. The pathology from that procedure, case number [id number], showed a residual superficial spreading melanoma, 1.9 mm in the greatest depth, tumor confined to the papillary dermis, which was Clark level II. Margins are negative. The lymph nodes of the left axilla, 2 sentinel lymph nodes wereidentified; none of them had any malignancy.   Current therapy: Observation and follow up.   Interim History: Kaitlyn Martinez presents today for a follow up visit. She is a very nice women with the above issues. Since her last visit, she has been doing well at this time. Clinically she has not had any other symptoms. Had not had any chest pain, had not had any shortness of breath, had not had any skin lesions. She has not had any skin cancer or any lesions removed in the past. She continues to follow with her Dermatologist. She is continuing to protect her skin with sun block and wearing hats. She has not reported any constitutional symptoms at this point. She has not reported any weight loss or appetite changes. Her performance status and activity levels all around baseline.  Medications: I have reviewed the patient's current medications. Current Outpatient Prescriptions  Medication Sig Dispense Refill  . cetirizine (ZYRTEC) 10 MG chewable tablet Chew 10 mg by mouth daily.      Marland Kitchen ibuprofen (ADVIL,MOTRIN) 200 MG tablet Take 200 mg by mouth every 6 (six) hours as needed.      . Multiple Vitamin (MULTIVITAMIN) tablet Take 1 tablet by mouth daily.       No current facility-administered medications for this visit.     Allergies:  Allergies  Allergen Reactions  . Codeine   . Penicillins     Past Medical History, Surgical history, Social history, and Family History were reviewed and updated.  Review of Systems: Constitutional:  Negative for fever, chills, night sweats, anorexia, weight loss, pain. Cardiovascular: no chest pain or dyspnea on exertion Respiratory: negative Neurological: negative Dermatological: negative ENT: negative Skin: Negative. Gastrointestinal: negative Genito-Urinary: negative Hematological and Lymphatic: negative Breast: negative Musculoskeletal: negative Remaining ROS negative. Physical Exam: Blood pressure 134/74, pulse 74, temperature 97.9 F (36.6 C), temperature source Oral, resp. rate 18, height 5\' 8"  (1.727 m), weight 190 lb 4.8 oz (86.32 kg). ECOG: 1 General appearance: alert Head: Normocephalic, without obvious abnormality, atraumatic Neck: no adenopathy, no carotid bruit, no JVD, supple, symmetrical, trachea midline and thyroid not enlarged, symmetric, no tenderness/mass/nodules Lymph nodes: Cervical, supraclavicular, and axillary nodes normal. Heart:regular rate and rhythm, S1, S2 normal, no murmur, click, rub or gallop Lung:chest clear, no wheezing, rales, normal symmetric air entry Abdomin: soft, non-tender, without masses or organomegaly EXT:no erythema, induration, or nodules Skin examination: No evidence of any lesions or abnormalities.  Lab Results: Lab Results  Component Value Date   WBC 7.5 09/17/2013   HGB 13.1 09/17/2013   HCT 39.1 09/17/2013   MCV 87.8 09/17/2013   PLT 214 09/17/2013     Chemistry      Component Value Date/Time   NA 141 03/18/2013 0941   NA 141 03/27/2012 1027   K 4.1 03/18/2013 0941   K 4.0  03/27/2012 1027   CL 107 03/18/2013 0941   CL 107 03/27/2012 1027   CO2 25 03/18/2013 0941   CO2 25 03/27/2012 1027   BUN 11.6 03/18/2013 0941   BUN 10 03/27/2012 1027   CREATININE 0.9 03/18/2013 0941   CREATININE 0.76 03/27/2012  1027      Component Value Date/Time   CALCIUM 9.7 03/18/2013 0941   CALCIUM 9.4 03/27/2012 1027   ALKPHOS 78 03/18/2013 0941   ALKPHOS 79 03/27/2012 1027   AST 17 03/18/2013 0941   AST 17 03/27/2012 1027   ALT 25 03/18/2013 0941   ALT 23 03/27/2012 1027   BILITOT 0.40 03/18/2013 0941   BILITOT 0.2* 03/27/2012 1027      Impression and Plan:  This is a pleasant 57 year old woman with the  following issues:  1. Superficial spreading melanoma. Her final staging is stage I with 1.9-mm Breslow measurements and Clark level of II. She has 2 sentinel lymph nodes that were negative. Her exam, labs and CXR from 03/2013 showed no abnormalities. For now, we will continue active follow up every 6 months with exam, labs and CXR every 12 months.   2. Breast cancer. Again, seems to be remote, appears to be in remission at this time. Continue to be monitored with annual mammograms and self-breast examination.    [facility], MD 12/10/20149:36 AM

## 2013-09-18 ENCOUNTER — Telehealth: Payer: Self-pay | Admitting: Oncology

## 2013-09-18 NOTE — Telephone Encounter (Signed)
lvm for pt regarding to June 2015 appt...mailed pt avs and letter

## 2013-11-20 ENCOUNTER — Emergency Department (HOSPITAL_COMMUNITY)
Admission: EM | Admit: 2013-11-20 | Discharge: 2013-11-20 | Disposition: A | Discharge status: Home / Self Care | Payer: No Typology Code available for payment source | Attending: Emergency Medicine | Admitting: Emergency Medicine

## 2013-11-20 ENCOUNTER — Encounter (HOSPITAL_COMMUNITY): Payer: Self-pay | Admitting: Emergency Medicine

## 2013-11-20 ENCOUNTER — Emergency Department (HOSPITAL_COMMUNITY): Payer: No Typology Code available for payment source

## 2013-11-20 DIAGNOSIS — Z85820 Personal history of malignant melanoma of skin: Secondary | ICD-10-CM | POA: Insufficient documentation

## 2013-11-20 DIAGNOSIS — Z853 Personal history of malignant neoplasm of breast: Secondary | ICD-10-CM | POA: Insufficient documentation

## 2013-11-20 DIAGNOSIS — S39012A Strain of muscle, fascia and tendon of lower back, initial encounter: Secondary | ICD-10-CM

## 2013-11-20 DIAGNOSIS — Z880 Allergy status to penicillin: Secondary | ICD-10-CM | POA: Insufficient documentation

## 2013-11-20 DIAGNOSIS — S335XXA Sprain of ligaments of lumbar spine, initial encounter: Secondary | ICD-10-CM | POA: Insufficient documentation

## 2013-11-20 DIAGNOSIS — Y92410 Unspecified street and highway as the place of occurrence of the external cause: Secondary | ICD-10-CM | POA: Insufficient documentation

## 2013-11-20 DIAGNOSIS — Y9389 Activity, other specified: Secondary | ICD-10-CM | POA: Insufficient documentation

## 2013-11-20 DIAGNOSIS — S139XXA Sprain of joints and ligaments of unspecified parts of neck, initial encounter: Secondary | ICD-10-CM | POA: Insufficient documentation

## 2013-11-20 DIAGNOSIS — S161XXA Strain of muscle, fascia and tendon at neck level, initial encounter: Secondary | ICD-10-CM

## 2013-11-20 DIAGNOSIS — F17200 Nicotine dependence, unspecified, uncomplicated: Secondary | ICD-10-CM | POA: Insufficient documentation

## 2013-11-20 MED ORDER — IBUPROFEN 800 MG PO TABS: 800.0000 mg | ORAL_TABLET | Freq: Three times a day (TID) | ORAL | Status: AC | PRN

## 2013-11-20 MED ORDER — OXYCODONE-ACETAMINOPHEN 5-325 MG PO TABS: 1.0000 | ORAL_TABLET | Freq: Four times a day (QID) | ORAL | Status: AC | PRN

## 2013-11-20 MED ORDER — HYDROCODONE-ACETAMINOPHEN 5-325 MG PO TABS
1.0000 | ORAL_TABLET | Freq: Once | ORAL | Status: AC
  Administered 2013-11-20: 1 via ORAL
  Filled 2013-11-20: qty 1

## 2013-11-20 MED ORDER — HYDROMORPHONE HCL PF 1 MG/ML IJ SOLN
1.0000 mg | Freq: Once | INTRAMUSCULAR | Status: AC
  Administered 2013-11-20: 1 mg via INTRAMUSCULAR
  Filled 2013-11-20: qty 1
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
## 2013-11-20 NOTE — Discharge Instructions (Signed)
Return here as needed. Your x-rays were normal. Use ice and heat on your neck and back.
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
## 2013-11-24 NOTE — ED Provider Notes (Signed)
Medical screening examination/treatment/procedure(s) were performed by non-physician practitioner and as supervising physician I was immediately available for consultation/collaboration.  EKG Interpretation   None        Virgel Manifold, MD 11/24/13 1416

## 2014-03-18 ENCOUNTER — Other Ambulatory Visit (HOSPITAL_BASED_OUTPATIENT_CLINIC_OR_DEPARTMENT_OTHER): Payer: Self-pay

## 2014-03-18 ENCOUNTER — Ambulatory Visit (HOSPITAL_COMMUNITY)
Admission: RE | Admit: 2014-03-18 | Discharge: 2014-03-18 | Disposition: A | Discharge status: Home / Self Care | Payer: Self-pay | Source: Ambulatory Visit | Attending: Oncology | Admitting: Oncology

## 2014-03-18 DIAGNOSIS — C50919 Malignant neoplasm of unspecified site of unspecified female breast: Secondary | ICD-10-CM | POA: Insufficient documentation

## 2014-03-18 DIAGNOSIS — C439 Malignant melanoma of skin, unspecified: Secondary | ICD-10-CM

## 2014-03-18 DIAGNOSIS — Z85820 Personal history of malignant melanoma of skin: Secondary | ICD-10-CM | POA: Insufficient documentation

## 2014-03-18 DIAGNOSIS — C4360 Malignant melanoma of unspecified upper limb, including shoulder: Secondary | ICD-10-CM

## 2014-03-18 LAB — COMPREHENSIVE METABOLIC PANEL (CC13)
ALBUMIN: 3.8 g/dL (ref 3.5–5.0)
ALK PHOS: 76 U/L (ref 40–150)
ALT: 21 U/L (ref 0–55)
AST: 17 U/L (ref 5–34)
Anion Gap: 8 mEq/L (ref 3–11)
BUN: 11.2 mg/dL (ref 7.0–26.0)
CALCIUM: 9.7 mg/dL (ref 8.4–10.4)
CHLORIDE: 108 meq/L (ref 98–109)
CO2: 26 mEq/L (ref 22–29)
Creatinine: 0.8 mg/dL (ref 0.6–1.1)
Glucose: 84 mg/dl (ref 70–140)
POTASSIUM: 4.3 meq/L (ref 3.5–5.1)
SODIUM: 142 meq/L (ref 136–145)
Total Bilirubin: 0.52 mg/dL (ref 0.20–1.20)
Total Protein: 7.4 g/dL (ref 6.4–8.3)

## 2014-03-18 LAB — CBC WITH DIFFERENTIAL/PLATELET
BASO%: 1.1 % (ref 0.0–2.0)
Basophils Absolute: 0.1 10*3/uL (ref 0.0–0.1)
EOS%: 2.5 % (ref 0.0–7.0)
Eosinophils Absolute: 0.2 10*3/uL (ref 0.0–0.5)
HCT: 41.1 % (ref 34.8–46.6)
HEMOGLOBIN: 13.5 g/dL (ref 11.6–15.9)
LYMPH#: 2.3 10*3/uL (ref 0.9–3.3)
LYMPH%: 33.4 % (ref 14.0–49.7)
MCH: 28.8 pg (ref 25.1–34.0)
MCHC: 32.9 g/dL (ref 31.5–36.0)
MCV: 87.5 fL (ref 79.5–101.0)
MONO#: 0.6 10*3/uL (ref 0.1–0.9)
MONO%: 8.2 % (ref 0.0–14.0)
NEUT#: 3.8 10*3/uL (ref 1.5–6.5)
NEUT%: 54.8 % (ref 38.4–76.8)
Platelets: 225 10*3/uL (ref 145–400)
RBC: 4.7 10*6/uL (ref 3.70–5.45)
RDW: 12.9 % (ref 11.2–14.5)
WBC: 7 10*3/uL (ref 3.9–10.3)

## 2014-03-19 ENCOUNTER — Encounter (HOSPITAL_COMMUNITY): Payer: Self-pay | Admitting: Emergency Medicine

## 2014-03-25 ENCOUNTER — Ambulatory Visit (HOSPITAL_BASED_OUTPATIENT_CLINIC_OR_DEPARTMENT_OTHER): Payer: Self-pay | Admitting: Oncology

## 2014-03-25 ENCOUNTER — Encounter: Payer: Self-pay | Admitting: Oncology

## 2014-03-25 ENCOUNTER — Telehealth: Payer: Self-pay | Admitting: Oncology

## 2014-03-25 VITALS — BP 122/60 | HR 70 | Temp 97.8°F | Resp 18 | Ht 68.0 in | Wt 196.0 lb

## 2014-03-25 DIAGNOSIS — C50919 Malignant neoplasm of unspecified site of unspecified female breast: Secondary | ICD-10-CM

## 2014-03-25 DIAGNOSIS — C439 Malignant melanoma of skin, unspecified: Secondary | ICD-10-CM

## 2014-03-25 DIAGNOSIS — C4360 Malignant melanoma of unspecified upper limb, including shoulder: Secondary | ICD-10-CM

## 2014-03-25 NOTE — Progress Notes (Signed)
[place]  [phone] [phone]  ID: Kaitlyn Martinez OB: 25-Feb-1956 MR#: [id number] IDP#:[id number] PCP: Beatris Si GYN:  SU:  OTHER MD: Dr. 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 Diagnosed 01/2012.  INTERVAL HISTORY: Kaitlyn Martinez presents today for Kaitlyn Martinez 6 month follow up visit with labs and chest xray. Since Kaitlyn Martinez last visit, Kaitlyn Martinez has been doing well. Clinically Kaitlyn Martinez has not had any other symptoms. Kaitlyn Martinez denies having any chest pain, palpitations or shortness of breath. Kaitlyn Martinez denies having any skin lesions. Kaitlyn Martinez has not had any skin cancer or any other lesions removed in Kaitlyn past except for Kaitlyn one on Kaitlyn Martinez left lower arm. Kaitlyn Martinez scar is well healed with no changes, erythema or bleeding. Kaitlyn Martinez continues to follow with Kaitlyn Martinez Dermatologist. Kaitlyn Martinez is continuing to protect Kaitlyn Martinez skin with sun block, wearing hats and not going outside until Kaitlyn evening. Kaitlyn Martinez has not reported any constitutional symptoms at this point. Kaitlyn Martinez has not reported any weight loss or appetite changes. Kaitlyn Martinez performance status and activity levels are similar to baseline. He does not report any headaches blurred vision or double vision. Kaitlyn Martinez does not report any chest pain cough or hemoptysis. Kaitlyn Martinez does not report any arthralgias or myalgias. Kaitlyn Martinez does not report any petechiae or rashes.  REVIEW OF SYSTEMS: Remaining ROS negative.   CURRENT THERAPY: Observation and follow up.  PAST MEDICAL HISTORY: Past Medical History  Diagnosis Date  . 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 Breast cancer 2000  . Arthritis   . Cancer 2000    breast  . Melanoma     PAST SURGICAL HISTORY: Past Surgical History  Procedure Laterality Date  . Breast surgery  2000    Lumpectomy-axillary node dissection  . Fracture surgery  2005    fx rt lower leg  . Tubal ligation    . I&d axilla  03/07/12    Lt arm axilla  . Melanoma  excision  03/07/12    lt forearm  . Lumpectomy    . Leg surgery      FAMILY HISTORY Family History  Problem Relation Age of Onset  . Heart disease Father    SOCIAL HISTORY: Current every day smoker.    HEALTH MAINTENANCE: History  Substance Use Topics  . Smoking status: Current Every Day Smoker  . Smokeless tobacco: Not on file  . Alcohol Use: Yes     Comment: rarely     Allergies  Allergen Reactions  . Codeine Anaphylaxis  . Penicillins Anaphylaxis  . Codeine   . Penicillins     Current Outpatient Prescriptions  Medication Sig Dispense Refill  . cetirizine (ZYRTEC) 10 MG chewable tablet Chew 10 mg by mouth daily.      Marland Kitchen ibuprofen (ADVIL,MOTRIN) 200 MG tablet Take 200 mg by mouth every 6 (six) hours as needed.      Marland Kitchen ibuprofen (ADVIL,MOTRIN) 800 MG tablet Take 1 tablet (800 mg total) by mouth every 8 (eight) hours as needed.  21 tablet  0  . Multiple Vitamin (MULTIVITAMIN WITH MINERALS) TABS Take 1 tablet by mouth every morning.      . Multiple Vitamin (MULTIVITAMIN) tablet Take 1 tablet by mouth daily.      Marland Kitchen oxyCODONE-acetaminophen (PERCOCET/ROXICET) 5-325 MG per tablet Take 1 tablet by mouth every 6 (six) hours as needed for severe pain.  15 tablet  0   No current facility-administered  medications for this visit.    OBJECTIVE: Filed Vitals:   03/25/14 1021  BP: 122/60  Pulse: 70  Temp: 97.8 F (36.6 C)  Resp: 18   Body mass index is 29.81 kg/(m^2).    ECOG FS:0 - Asymptomatic  Ocular: Sclerae unicteric, pupils equal, round and reactive to light Ear-nose-throat: Oropharynx clear, dentition fair Lymphatic: No cervical or supraclavicular adenopathy Lungs no rales or rhonchi, good excursion bilaterally Heart regular rate and rhythm, no murmur appreciated Abd soft, nontender, positive bowel sounds MSK no focal spinal tenderness, no joint edema Neuro: non-focal, well-oriented, appropriate affect Skin examination: No rashes or lesions.  LAB  RESULTS: CBC    Component Value Date/Time   WBC 7.0 03/18/2014 0949   WBC 14.5* 10/19/2012 0214   RBC 4.70 03/18/2014 0949   RBC 4.28 10/19/2012 0214   HGB 13.5 03/18/2014 0949   HGB 12.5 10/19/2012 0214   HCT 41.1 03/18/2014 0949   HCT 37.0 10/19/2012 0214   PLT 225 03/18/2014 0949   PLT 215 10/19/2012 0214   MCV 87.5 03/18/2014 0949   MCV 86.4 10/19/2012 0214   MCH 28.8 03/18/2014 0949   MCH 29.2 10/19/2012 0214   MCHC 32.9 03/18/2014 0949   MCHC 33.8 10/19/2012 0214   RDW 12.9 03/18/2014 0949   RDW 13.0 10/19/2012 0214   LYMPHSABS 2.3 03/18/2014 0949   LYMPHSABS 2.3 10/19/2012 0214   MONOABS 0.6 03/18/2014 0949   MONOABS 1.5* 10/19/2012 0214   EOSABS 0.2 03/18/2014 0949   EOSABS 0.1 10/19/2012 0214   BASOSABS 0.1 03/18/2014 0949   BASOSABS 0.0 10/19/2012 0214    CMP     Component Value Date/Time   NA 142 03/18/2014 0949   NA 136 10/19/2012 0214   K 4.3 03/18/2014 0949   K 3.9 10/19/2012 0214   CL 107 03/18/2013 0941   CL 105 10/19/2012 0214   CO2 26 03/18/2014 0949   CO2 21 10/19/2012 0214   GLUCOSE 84 03/18/2014 0949   GLUCOSE 94 03/18/2013 0941   GLUCOSE 116* 10/19/2012 0214   BUN 11.2 03/18/2014 0949   BUN 11 10/19/2012 0214   CREATININE 0.8 03/18/2014 0949   CREATININE 0.67 10/19/2012 0214   CALCIUM 9.7 03/18/2014 0949   CALCIUM 9.5 10/19/2012 0214   PROT 7.4 03/18/2014 0949   PROT 6.9 03/27/2012 1027   ALBUMIN 3.8 03/18/2014 0949   ALBUMIN 4.0 03/27/2012 1027   AST 17 03/18/2014 0949   AST 17 03/27/2012 1027   ALT 21 03/18/2014 0949   ALT 23 03/27/2012 1027   ALKPHOS 76 03/18/2014 0949   ALKPHOS 79 03/27/2012 1027   BILITOT 0.52 03/18/2014 0949   BILITOT 0.2* 03/27/2012 1027   GFRNONAA >90 10/19/2012 0214   GFRAA >90 10/19/2012 0214    STUDIES: Dg Chest 2 View  03/18/2014   CLINICAL DATA:  Breast cancer.  History melanoma.  EXAM: CHEST  2 VIEW  COMPARISON:  Chest x-ray 03/17/2013.  FINDINGS: Mediastinum and hilar structures normal. Lungs are clear. No pleural effusion or pneumothorax. Heart size  and pulmonary vascularity normal. No acute bony abnormality. Surgical clips left chest.  IMPRESSION: No active or focal cardiopulmonary disease.   Electronically Signed   By: Marcello Moores  Register   On: 03/18/2014 12:29    ASSESSMENT/PLAN: This is a pleasant 58 year old woman here for Kaitlyn Martinez 6 month follow up for superficial spreading melanoma.   Kaitlyn Martinez final staging is stage I with 1.9-mm Breslow measurements and Clark level of II. Kaitlyn Martinez has 2 sentinel lymph nodes  that were negative. Kaitlyn Martinez exam, labs and CXR from 03/2014 showed no abnormalities. Kaitlyn Martinez states that Kaitlyn Martinez sees dermatology regularly with Kaitlyn Martinez last visit 10/2013 being unremarkable.   For now, we will continue active follow up every 12 months with exam, labs and CXR.   2. Breast cancer appears to be in remission at this time. Continue to be monitored with annual mammograms and self-breast examination. Encouraged Martinez to follow up with Kaitlyn Martinez OB/GYN.  Eliezer Bottom, NP 03/25/2014 10:22 AM  Martinez seen and examined personally and Kaitlyn Martinez has no complaints. Kaitlyn Martinez has not reported any skin rashes or lesions has not had any constitutional symptoms.  On physical exam, Kaitlyn Martinez awake alert not in any distress. Skin examination showed no rashes or lesions. Heart was regular rate and rhythm lungs are clear to auscultation. Abdomen is soft and Kaitlyn Martinez extremities had no edema.  Laboratory data and chest x-ray were reviewed personally and showed no abnormalities.  Impression and plan: 58 year old woman with history of melanoma without any evidence of relapse. We'll continue to follow Kaitlyn Martinez annually moving forward with chest x-ray and laboratory data. And we'll do imaging studies such as CT scans Kaitlyn Martinez develops any specific symptoms.  [facility] MD 03/25/2014

## 2014-03-25 NOTE — Telephone Encounter (Signed)
gv adn printed appt sched adn avs for pt for June 2016

## 2015-03-24 ENCOUNTER — Telehealth: Payer: Self-pay | Admitting: Oncology

## 2015-03-24 ENCOUNTER — Ambulatory Visit (HOSPITAL_BASED_OUTPATIENT_CLINIC_OR_DEPARTMENT_OTHER): Payer: BLUE CROSS/BLUE SHIELD | Admitting: Oncology

## 2015-03-24 ENCOUNTER — Ambulatory Visit (HOSPITAL_COMMUNITY)
Admission: RE | Admit: 2015-03-24 | Discharge: 2015-03-24 | Disposition: A | Discharge status: Home / Self Care | Payer: BLUE CROSS/BLUE SHIELD | Source: Ambulatory Visit | Attending: Family | Admitting: Family

## 2015-03-24 ENCOUNTER — Other Ambulatory Visit (HOSPITAL_BASED_OUTPATIENT_CLINIC_OR_DEPARTMENT_OTHER): Payer: BLUE CROSS/BLUE SHIELD

## 2015-03-24 VITALS — BP 112/59 | HR 57 | Temp 98.2°F | Resp 17 | Ht 68.0 in | Wt 191.4 lb

## 2015-03-24 DIAGNOSIS — Z853 Personal history of malignant neoplasm of breast: Secondary | ICD-10-CM | POA: Diagnosis not present

## 2015-03-24 DIAGNOSIS — C439 Malignant melanoma of skin, unspecified: Secondary | ICD-10-CM

## 2015-03-24 DIAGNOSIS — Z85820 Personal history of malignant melanoma of skin: Secondary | ICD-10-CM | POA: Insufficient documentation

## 2015-03-24 LAB — CBC WITH DIFFERENTIAL/PLATELET
BASO%: 1.4 % (ref 0.0–2.0)
BASOS ABS: 0.1 10*3/uL (ref 0.0–0.1)
EOS%: 2.9 % (ref 0.0–7.0)
Eosinophils Absolute: 0.2 10*3/uL (ref 0.0–0.5)
HCT: 40.9 % (ref 34.8–46.6)
HEMOGLOBIN: 13.5 g/dL (ref 11.6–15.9)
LYMPH%: 30.8 % (ref 14.0–49.7)
MCH: 28.7 pg (ref 25.1–34.0)
MCHC: 33.1 g/dL (ref 31.5–36.0)
MCV: 86.6 fL (ref 79.5–101.0)
MONO#: 0.6 10*3/uL (ref 0.1–0.9)
MONO%: 7 % (ref 0.0–14.0)
NEUT#: 4.6 10*3/uL (ref 1.5–6.5)
NEUT%: 57.9 % (ref 38.4–76.8)
Platelets: 210 10*3/uL (ref 145–400)
RBC: 4.72 10*6/uL (ref 3.70–5.45)
RDW: 13 % (ref 11.2–14.5)
WBC: 8 10*3/uL (ref 3.9–10.3)
lymph#: 2.5 10*3/uL (ref 0.9–3.3)

## 2015-03-24 LAB — COMPREHENSIVE METABOLIC PANEL (CC13)
ALBUMIN: 3.9 g/dL (ref 3.5–5.0)
ALT: 19 U/L (ref 0–55)
ANION GAP: 9 meq/L (ref 3–11)
AST: 16 U/L (ref 5–34)
Alkaline Phosphatase: 79 U/L (ref 40–150)
BILIRUBIN TOTAL: 0.47 mg/dL (ref 0.20–1.20)
BUN: 10.9 mg/dL (ref 7.0–26.0)
CALCIUM: 9.8 mg/dL (ref 8.4–10.4)
CO2: 24 mEq/L (ref 22–29)
CREATININE: 0.8 mg/dL (ref 0.6–1.1)
Chloride: 107 mEq/L (ref 98–109)
EGFR: 80 mL/min/{1.73_m2} — ABNORMAL LOW (ref >90)
Glucose: 100 mg/dl (ref 70–140)
POTASSIUM: 4.2 meq/L (ref 3.5–5.1)
SODIUM: 140 meq/L (ref 136–145)
Total Protein: 7.3 g/dL (ref 6.4–8.3)

## 2015-03-24 NOTE — Telephone Encounter (Signed)
Pt confirmed labs/ov per 06/14 POF, gave pt AVS and Calendar.... KJ °

## 2015-03-24 NOTE — Progress Notes (Signed)
Hematology and Oncology Follow Up Visit  Kaitlyn Martinez [id number] Dec 04, 1955 59 y.o. 03/24/2015 9:47 AM    Principle Diagnosis: 59 year old with superficial spreading melanoma stage I with 1.9-mm Breslow measurements and Clark level  of II. Diagnosed 01/2012.  Prior Therapy: She is S/P a wide excision by Dr. Brantley Stage. That was performed on Mar 07, 2012, as well as a sentinel lymph node biopsy. The pathology from that procedure, case number [id number], showed a residual superficial spreading melanoma, 1.9 mm in the greatest depth, tumor confined to the papillary dermis, which was Clark level II. Margins are negative. The lymph nodes of the left axilla, 2 sentinel lymph nodes wereidentified; none of them had any malignancy.   Current therapy: Observation and follow up.   Interim History: Ms. Peer presents today for a follow up visit. Since her last visit, she reports no complaints.she has not reported any skin lesions or rashes. She has not reported any constitutional symptoms of weight loss or appetite changes. She continues to perform activities of daily living without any hindrance or decline.  She reports no chest pain, had not had any shortness of breath. She continues to follow with her Dermatologist. She is continuing to protect her skin with sun block and wearing hats.  She does not report any headaches, blurry vision, syncope or seizures. She does not report any fevers, chills, sweats. She does not report any shortness of breath, cough or hemoptysis. She does not report any nausea, vomiting, abdominal pain she does report occasional constipation. She does not report any frequency urgency or hesitancy. Remaining review of systems unremarkable.  Medications: I have reviewed the patient's current medications. Current Outpatient Prescriptions  Medication Sig Dispense Refill  . Multiple Vitamin (MULTIVITAMIN WITH MINERALS) TABS Take 1 tablet by mouth every morning.    . cetirizine  (ZYRTEC) 10 MG chewable tablet Chew 10 mg by mouth daily.    Marland Kitchen ibuprofen (ADVIL,MOTRIN) 200 MG tablet Take 200 mg by mouth every 6 (six) hours as needed.    Marland Kitchen ibuprofen (ADVIL,MOTRIN) 800 MG tablet Take 1 tablet (800 mg total) by mouth every 8 (eight) hours as needed. (Patient not taking: Reported on 03/24/2015) 21 tablet 0  . oxyCODONE-acetaminophen (PERCOCET/ROXICET) 5-325 MG per tablet Take 1 tablet by mouth every 6 (six) hours as needed for severe pain. (Patient not taking: Reported on 03/24/2015) 15 tablet 0   No current facility-administered medications for this visit.    Allergies:  Allergies  Allergen Reactions  . Codeine Anaphylaxis  . Penicillins Anaphylaxis  . Codeine   . Penicillins     Past Medical History, Surgical history, Social history, and Family History were reviewed and updated.   Physical Exam: Blood pressure 112/59, pulse 57, temperature 98.2 F (36.8 C), temperature source Oral, resp. rate 17, height 5\' 8"  (1.727 m), weight 191 lb 6.4 oz (86.818 kg), SpO2 98 %. ECOG: 1 General appearance: alert awake not in any distress. Head: Normocephalic, without obvious abnormality Neck: no adenopathy Lymph nodes: Cervical, supraclavicular, and axillary nodes normal. Heart:regular rate and rhythm, S1, S2 normal, no murmur, click, rub or gallop Lung:chest clear, no wheezing, rales, normal symmetric air entry Abdomin: soft, non-tender, without masses or organomegaly EXT:no erythema, induration, or nodules Skin examination: No evidence of any lesions or abnormalities.  Lab Results: Lab Results  Component Value Date   WBC 8.0 03/24/2015   HGB 13.5 03/24/2015   HCT 40.9 03/24/2015   MCV 86.6 03/24/2015   PLT 210 03/24/2015  Chemistry      Component Value Date/Time   NA 142 03/18/2014 0949   NA 136 10/19/2012 0214   K 4.3 03/18/2014 0949   K 3.9 10/19/2012 0214   CL 107 03/18/2013 0941   CL 105 10/19/2012 0214   CO2 26 03/18/2014 0949   CO2 21 10/19/2012 0214    BUN 11.2 03/18/2014 0949   BUN 11 10/19/2012 0214   CREATININE 0.8 03/18/2014 0949   CREATININE 0.67 10/19/2012 0214      Component Value Date/Time   CALCIUM 9.7 03/18/2014 0949   CALCIUM 9.5 10/19/2012 0214   ALKPHOS 76 03/18/2014 0949   ALKPHOS 79 03/27/2012 1027   AST 17 03/18/2014 0949   AST 17 03/27/2012 1027   ALT 21 03/18/2014 0949   ALT 23 03/27/2012 1027   BILITOT 0.52 03/18/2014 0949   BILITOT 0.2* 03/27/2012 1027      Impression and Plan:  This is a pleasant 59 year old woman with the  following issues:  1. Superficial spreading melanoma. Her final staging is stage I with 1.9-mm Breslow measurements and Clark level of II. She has 2 sentinel lymph nodes that were negative. Her exam, labs and chest x-ray did not reveal any recurrence. The plan is to continue with annual surveillance at this time. I continue to urge her to follow up with dermatology as she is doing.  2. Breast cancer. Again, seems to be remote, appears to be in remission at this time. Continue to be monitored with annual mammograms and self-breast examination.    [facility], MD 6/15/20169:47 AM

## 2016-03-13 ENCOUNTER — Other Ambulatory Visit (HOSPITAL_BASED_OUTPATIENT_CLINIC_OR_DEPARTMENT_OTHER): Payer: Self-pay | Admitting: Family Medicine

## 2016-03-13 ENCOUNTER — Other Ambulatory Visit: Payer: Self-pay | Admitting: Physician Assistant

## 2016-03-13 DIAGNOSIS — R110 Nausea: Secondary | ICD-10-CM | POA: Diagnosis not present

## 2016-03-13 DIAGNOSIS — R52 Pain, unspecified: Secondary | ICD-10-CM

## 2016-03-13 DIAGNOSIS — Z1211 Encounter for screening for malignant neoplasm of colon: Secondary | ICD-10-CM | POA: Diagnosis not present

## 2016-03-13 DIAGNOSIS — Z1231 Encounter for screening mammogram for malignant neoplasm of breast: Secondary | ICD-10-CM

## 2016-03-13 DIAGNOSIS — Z1239 Encounter for other screening for malignant neoplasm of breast: Secondary | ICD-10-CM | POA: Diagnosis not present

## 2016-03-13 DIAGNOSIS — R1032 Left lower quadrant pain: Secondary | ICD-10-CM | POA: Diagnosis not present

## 2016-03-14 ENCOUNTER — Ambulatory Visit (HOSPITAL_BASED_OUTPATIENT_CLINIC_OR_DEPARTMENT_OTHER): Payer: BLUE CROSS/BLUE SHIELD

## 2016-03-15 ENCOUNTER — Ambulatory Visit (HOSPITAL_BASED_OUTPATIENT_CLINIC_OR_DEPARTMENT_OTHER)
Admission: RE | Admit: 2016-03-15 | Discharge: 2016-03-15 | Disposition: A | Discharge status: Home / Self Care | Payer: BLUE CROSS/BLUE SHIELD | Source: Ambulatory Visit | Attending: Family Medicine | Admitting: Family Medicine

## 2016-03-15 ENCOUNTER — Encounter (HOSPITAL_BASED_OUTPATIENT_CLINIC_OR_DEPARTMENT_OTHER): Payer: Self-pay

## 2016-03-15 DIAGNOSIS — R52 Pain, unspecified: Secondary | ICD-10-CM | POA: Diagnosis not present

## 2016-03-15 DIAGNOSIS — D3502 Benign neoplasm of left adrenal gland: Secondary | ICD-10-CM | POA: Diagnosis not present

## 2016-03-15 DIAGNOSIS — K921 Melena: Secondary | ICD-10-CM | POA: Diagnosis not present

## 2016-03-15 MED ORDER — IOPAMIDOL (ISOVUE-300) INJECTION 61%
100.0000 mL | Freq: Once | INTRAVENOUS | Status: AC | PRN
  Administered 2016-03-15: 100 mL via INTRAVENOUS

## 2016-03-20 ENCOUNTER — Ambulatory Visit
Admission: RE | Admit: 2016-03-20 | Discharge: 2016-03-20 | Disposition: A | Discharge status: Home / Self Care | Payer: BLUE CROSS/BLUE SHIELD | Source: Ambulatory Visit | Attending: Physician Assistant | Admitting: Physician Assistant

## 2016-03-20 DIAGNOSIS — Z1231 Encounter for screening mammogram for malignant neoplasm of breast: Secondary | ICD-10-CM | POA: Diagnosis not present

## 2016-03-21 ENCOUNTER — Other Ambulatory Visit (HOSPITAL_BASED_OUTPATIENT_CLINIC_OR_DEPARTMENT_OTHER): Payer: BLUE CROSS/BLUE SHIELD

## 2016-03-21 ENCOUNTER — Telehealth: Payer: Self-pay | Admitting: Oncology

## 2016-03-21 ENCOUNTER — Ambulatory Visit (HOSPITAL_BASED_OUTPATIENT_CLINIC_OR_DEPARTMENT_OTHER): Payer: BLUE CROSS/BLUE SHIELD | Admitting: Oncology

## 2016-03-21 VITALS — BP 118/67 | HR 57 | Temp 98.2°F | Resp 20 | Ht 68.0 in | Wt 191.0 lb

## 2016-03-21 DIAGNOSIS — Z853 Personal history of malignant neoplasm of breast: Secondary | ICD-10-CM | POA: Diagnosis not present

## 2016-03-21 DIAGNOSIS — Z85820 Personal history of malignant melanoma of skin: Secondary | ICD-10-CM

## 2016-03-21 DIAGNOSIS — Z85520 Personal history of malignant carcinoid tumor of kidney: Secondary | ICD-10-CM

## 2016-03-21 DIAGNOSIS — C439 Malignant melanoma of skin, unspecified: Secondary | ICD-10-CM

## 2016-03-21 DIAGNOSIS — R109 Unspecified abdominal pain: Secondary | ICD-10-CM

## 2016-03-21 LAB — CBC WITH DIFFERENTIAL/PLATELET
BASO%: 0.6 % (ref 0.0–2.0)
Basophils Absolute: 0.1 10*3/uL (ref 0.0–0.1)
EOS ABS: 0.2 10*3/uL (ref 0.0–0.5)
EOS%: 2.2 % (ref 0.0–7.0)
HCT: 39.1 % (ref 34.8–46.6)
HGB: 12.8 g/dL (ref 11.6–15.9)
LYMPH%: 30.8 % (ref 14.0–49.7)
MCH: 29 pg (ref 25.1–34.0)
MCHC: 32.7 g/dL (ref 31.5–36.0)
MCV: 88.7 fL (ref 79.5–101.0)
MONO#: 0.7 10*3/uL (ref 0.1–0.9)
MONO%: 9.1 % (ref 0.0–14.0)
NEUT#: 4.7 10*3/uL (ref 1.5–6.5)
NEUT%: 57.3 % (ref 38.4–76.8)
PLATELETS: 231 10*3/uL (ref 145–400)
RBC: 4.41 10*6/uL (ref 3.70–5.45)
RDW: 13.3 % (ref 11.2–14.5)
WBC: 8.2 10*3/uL (ref 3.9–10.3)
lymph#: 2.5 10*3/uL (ref 0.9–3.3)

## 2016-03-21 LAB — COMPREHENSIVE METABOLIC PANEL
ALK PHOS: 71 U/L (ref 40–150)
ALT: 21 U/L (ref 0–55)
ANION GAP: 7 meq/L (ref 3–11)
AST: 14 U/L (ref 5–34)
Albumin: 3.9 g/dL (ref 3.5–5.0)
BUN: 11.3 mg/dL (ref 7.0–26.0)
CALCIUM: 10 mg/dL (ref 8.4–10.4)
CHLORIDE: 107 meq/L (ref 98–109)
CO2: 25 mEq/L (ref 22–29)
CREATININE: 0.8 mg/dL (ref 0.6–1.1)
EGFR: 76 mL/min/{1.73_m2} — ABNORMAL LOW (ref >90)
Glucose: 84 mg/dl (ref 70–140)
POTASSIUM: 4.4 meq/L (ref 3.5–5.1)
Sodium: 139 mEq/L (ref 136–145)
Total Bilirubin: 0.43 mg/dL (ref 0.20–1.20)
Total Protein: 7.4 g/dL (ref 6.4–8.3)

## 2016-03-21 LAB — LACTATE DEHYDROGENASE: LDH: 140 U/L (ref 125–245)

## 2016-03-21 NOTE — Telephone Encounter (Signed)
Gave and printed appt sched and avs for pt for June 2018...Marland KitchenMarland KitchenMarland Kitchenpof not sent

## 2016-03-21 NOTE — Progress Notes (Signed)
Hematology and Oncology Follow Up Visit  MANETTA BOYNES WI:[id number] Feb 13, 1956 60 y.o. 03/21/2016 8:17 AM    Principle Diagnosis: 60 year old with: 1. Superficial spreading melanoma stage I with 1.9-mm Breslow measurements and Clark level of II. Diagnosed 01/2012. 2. Left-sided breast cancer diagnosed in 2005. She is status post lumpectomy, radiation and 5 years of hormone therapy.  Prior Therapy: She is S/P a wide excision by Dr. Brantley Stage. That was performed on Mar 07, 2012, as well as a sentinel lymph node biopsy. The pathology from that procedure, case number [id number], showed a residual superficial spreading melanoma, 1.9 mm in the greatest depth, tumor confined to the papillary dermis, which was Clark level II. Margins are negative. The lymph nodes of the left axilla, 2 sentinel lymph nodes wereidentified; none of them had any malignancy.   Current therapy: Observation and follow up.   Interim History: Ms. Huestis presents today for a follow up visit. Since her last visit, she reports periodic lower abdominal pain. Her pain is described as crampy intermittent in nature. Not associated with any nausea or vomiting. Not associated with dietary habits. She does not report any weight loss. She had a CT scan done which did not show any abnormalities. She is scheduled to have a colonoscopy in the near future.   She has not reported any skin lesions or rashes. She denied any lymphadenopathy or masses. She continues to perform activities of daily living without any hindrance or decline.    She does not report any headaches, blurry vision, syncope or seizures. She does not report any fevers, chills, sweats. She does not report any shortness of breath, cough or hemoptysis. She does not report any nausea, vomiting, abdominal pain she does report occasional constipation. She does not report any frequency urgency or hesitancy. Remaining review of systems unremarkable.  Medications: I have reviewed the  patient's current medications. Current Outpatient Prescriptions  Medication Sig Dispense Refill  . cetirizine (ZYRTEC) 10 MG chewable tablet Chew 10 mg by mouth daily.    Marland Kitchen ibuprofen (ADVIL,MOTRIN) 200 MG tablet Take 200 mg by mouth every 6 (six) hours as needed.    Marland Kitchen ibuprofen (ADVIL,MOTRIN) 800 MG tablet Take 1 tablet (800 mg total) by mouth every 8 (eight) hours as needed. (Patient not taking: Reported on 03/24/2015) 21 tablet 0  . Multiple Vitamin (MULTIVITAMIN WITH MINERALS) TABS Take 1 tablet by mouth every morning.    Marland Kitchen oxyCODONE-acetaminophen (PERCOCET/ROXICET) 5-325 MG per tablet Take 1 tablet by mouth every 6 (six) hours as needed for severe pain. (Patient not taking: Reported on 03/24/2015) 15 tablet 0   No current facility-administered medications for this visit.    Allergies:  Allergies  Allergen Reactions  . Codeine Anaphylaxis  . Penicillins Anaphylaxis  . Codeine   . Penicillins     Past Medical History, Surgical history, Social history, and Family History were reviewed and updated.   Physical Exam: Blood pressure 118/67, pulse 57, temperature 98.2 F (36.8 C), temperature source Oral, resp. rate 20, height 5\' 8"  (1.727 m), weight 191 lb (86.637 kg), SpO2 97 %. ECOG: 1 General appearance: Pleasant-appearing woman without distress. Head: Normocephalic, without obvious abnormality no oral ulcers or lesions. Neck: no adenopathy Lymph nodes: Cervical, supraclavicular, and axillary nodes normal. Heart:regular rate and rhythm, S1, S2 normal, no murmur, click, rub or gallop Lung:chest clear, no wheezing, rales, normal symmetric air entry no dullness to percussion. Abdomin: soft, non-tender, without masses or organomegaly no shifting dullness or ascites. EXT:no erythema, induration, or  nodules Skin examination: No evidence of any lesions or abnormalities.  Lab Results: Lab Results  Component Value Date   WBC 8.2 03/21/2016   HGB 12.8 03/21/2016   HCT 39.1 03/21/2016    MCV 88.7 03/21/2016   PLT 231 03/21/2016     Chemistry      Component Value Date/Time   NA 140 03/24/2015 0924   NA 136 10/19/2012 0214   K 4.2 03/24/2015 0924   K 3.9 10/19/2012 0214   CL 107 03/18/2013 0941   CL 105 10/19/2012 0214   CO2 24 03/24/2015 0924   CO2 21 10/19/2012 0214   BUN 10.9 03/24/2015 0924   BUN 11 10/19/2012 0214   CREATININE 0.8 03/24/2015 0924   CREATININE 0.67 10/19/2012 0214      Component Value Date/Time   CALCIUM 9.8 03/24/2015 0924   CALCIUM 9.5 10/19/2012 0214   ALKPHOS 79 03/24/2015 0924   ALKPHOS 79 03/27/2012 1027   AST 16 03/24/2015 0924   AST 17 03/27/2012 1027   ALT 19 03/24/2015 0924   ALT 23 03/27/2012 1027   BILITOT 0.47 03/24/2015 0924   BILITOT 0.2* 03/27/2012 1027     EXAM: CT ABDOMEN AND PELVIS WITH CONTRAST  TECHNIQUE: Multidetector CT imaging of the abdomen and pelvis was performed using the standard protocol following bolus administration of intravenous contrast.  CONTRAST: 154mL ISOVUE-300 IOPAMIDOL (ISOVUE-300) INJECTION 61%  COMPARISON: None.  FINDINGS: Lower chest: Lung bases are clear.  Hepatobiliary: Liver is within normal limits. No suspicious/enhancing hepatic lesions.  Gallbladder is unremarkable. No intrahepatic or extrahepatic ductal dilatation.  Pancreas: Within normal limits.  Spleen: Within normal limits.  Adrenals/Urinary Tract: 10 mm left adrenal nodule (series 2/ image 25), with washout characteristics compatible with a benign adrenal adenoma.  Right adrenal gland is within normal limits.  Two small right renal cysts measuring up to 7 mm (series 7/image 26). No enhancing renal lesions. No hydronephrosis.  Bladder is within normal limits.  Stomach/Bowel: Stomach is within normal limits.  No evidence of bowel obstruction.  Normal appendix (series 2/image 78).  No colonic wall thickening or mass is seen.  Vascular/Lymphatic: No evidence of abdominal aortic  aneurysm.  No suspicious abdominopelvic lymphadenopathy.  Reproductive: Uterus is within normal limits.  Bilateral ovaries are within normal limits.  Other: No abdominopelvic ascites.  Musculoskeletal: Mild degenerative changes at L5-S1.  IMPRESSION: No evidence of bowel obstruction. Normal appendix.  No colonic wall thickening or mass is seen.  10 mm left adrenal adenoma, benign.    Impression and Plan:  This is a pleasant 60 year old woman with the  following issues:  1. Superficial spreading melanoma. Her final staging is stage I with 1.9-mm Breslow measurements and Clark level of II. She has 2 sentinel lymph nodes that were negative.   She is currently on active surveillance for the last 4 years without any evidence of recurrent disease. Her laboratory data, imaging studies and physical examination showed no abnormalities. The plan is to continue with active surveillance on an annual basis until 2018. No further oncology follow-up is needed beyond that.  I recommended continue dermatology surveillance as well which will be continued indefinitely. I also counseled her about strategies to protect her skin which she is aware of.  2. Breast cancer. Diagnosed in 2005 without any evidence of recurrence. She is up-to-date on her mammography which is done annually.  3. Abdominal pain: Workup is ongoing and colonoscopies pending. CT scan on 03/15/2016 was normal.  4. Follow-up: Will be in  12 months sooner if there is any issues.    [facility], MD 6/13/20178:17 AM

## 2016-03-24 ENCOUNTER — Other Ambulatory Visit: Payer: Self-pay | Admitting: Physician Assistant

## 2016-03-24 DIAGNOSIS — R928 Other abnormal and inconclusive findings on diagnostic imaging of breast: Secondary | ICD-10-CM

## 2016-03-27 DIAGNOSIS — R110 Nausea: Secondary | ICD-10-CM | POA: Diagnosis not present

## 2016-03-31 ENCOUNTER — Ambulatory Visit
Admission: RE | Admit: 2016-03-31 | Discharge: 2016-03-31 | Disposition: A | Discharge status: Home / Self Care | Payer: BLUE CROSS/BLUE SHIELD | Source: Ambulatory Visit | Attending: Physician Assistant | Admitting: Physician Assistant

## 2016-03-31 DIAGNOSIS — R928 Other abnormal and inconclusive findings on diagnostic imaging of breast: Secondary | ICD-10-CM

## 2016-03-31 DIAGNOSIS — N63 Unspecified lump in breast: Secondary | ICD-10-CM | POA: Diagnosis not present

## 2016-04-17 DIAGNOSIS — R1032 Left lower quadrant pain: Secondary | ICD-10-CM | POA: Diagnosis not present

## 2016-04-17 DIAGNOSIS — Z01818 Encounter for other preprocedural examination: Secondary | ICD-10-CM | POA: Diagnosis not present

## 2016-04-17 DIAGNOSIS — Z1211 Encounter for screening for malignant neoplasm of colon: Secondary | ICD-10-CM | POA: Diagnosis not present

## 2016-06-07 DIAGNOSIS — K621 Rectal polyp: Secondary | ICD-10-CM | POA: Diagnosis not present

## 2016-06-07 DIAGNOSIS — D125 Benign neoplasm of sigmoid colon: Secondary | ICD-10-CM | POA: Diagnosis not present

## 2016-06-07 DIAGNOSIS — K640 First degree hemorrhoids: Secondary | ICD-10-CM | POA: Diagnosis not present

## 2016-06-07 DIAGNOSIS — Z1211 Encounter for screening for malignant neoplasm of colon: Secondary | ICD-10-CM | POA: Diagnosis not present

## 2016-06-07 DIAGNOSIS — D126 Benign neoplasm of colon, unspecified: Secondary | ICD-10-CM | POA: Diagnosis not present

## 2016-12-20 DIAGNOSIS — R1032 Left lower quadrant pain: Secondary | ICD-10-CM | POA: Diagnosis not present

## 2016-12-20 DIAGNOSIS — R3129 Other microscopic hematuria: Secondary | ICD-10-CM | POA: Diagnosis not present

## 2016-12-20 DIAGNOSIS — Z124 Encounter for screening for malignant neoplasm of cervix: Secondary | ICD-10-CM | POA: Diagnosis not present

## 2017-03-13 DIAGNOSIS — L03116 Cellulitis of left lower limb: Secondary | ICD-10-CM | POA: Diagnosis not present

## 2017-03-13 DIAGNOSIS — W57XXXA Bitten or stung by nonvenomous insect and other nonvenomous arthropods, initial encounter: Secondary | ICD-10-CM | POA: Diagnosis not present

## 2017-03-13 DIAGNOSIS — S80862A Insect bite (nonvenomous), left lower leg, initial encounter: Secondary | ICD-10-CM | POA: Diagnosis not present

## 2017-03-20 ENCOUNTER — Other Ambulatory Visit (HOSPITAL_BASED_OUTPATIENT_CLINIC_OR_DEPARTMENT_OTHER): Payer: BLUE CROSS/BLUE SHIELD

## 2017-03-20 ENCOUNTER — Ambulatory Visit (HOSPITAL_BASED_OUTPATIENT_CLINIC_OR_DEPARTMENT_OTHER): Payer: BLUE CROSS/BLUE SHIELD | Admitting: Oncology

## 2017-03-20 VITALS — BP 110/62 | HR 63 | Temp 98.7°F | Resp 18 | Ht 68.0 in | Wt 196.4 lb

## 2017-03-20 DIAGNOSIS — Z85820 Personal history of malignant melanoma of skin: Secondary | ICD-10-CM

## 2017-03-20 DIAGNOSIS — C439 Malignant melanoma of skin, unspecified: Secondary | ICD-10-CM

## 2017-03-20 DIAGNOSIS — Z853 Personal history of malignant neoplasm of breast: Secondary | ICD-10-CM

## 2017-03-20 LAB — CBC WITH DIFFERENTIAL/PLATELET
BASO%: 0.7 % (ref 0.0–2.0)
Basophils Absolute: 0.1 10*3/uL (ref 0.0–0.1)
EOS ABS: 0.2 10*3/uL (ref 0.0–0.5)
EOS%: 2.4 % (ref 0.0–7.0)
HEMATOCRIT: 41 % (ref 34.8–46.6)
HEMOGLOBIN: 13.1 g/dL (ref 11.6–15.9)
LYMPH#: 2.6 10*3/uL (ref 0.9–3.3)
LYMPH%: 34.2 % (ref 14.0–49.7)
MCH: 28.8 pg (ref 25.1–34.0)
MCHC: 32 g/dL (ref 31.5–36.0)
MCV: 90.1 fL (ref 79.5–101.0)
MONO#: 0.5 10*3/uL (ref 0.1–0.9)
MONO%: 7 % (ref 0.0–14.0)
NEUT%: 55.7 % (ref 38.4–76.8)
NEUTROS ABS: 4.2 10*3/uL (ref 1.5–6.5)
PLATELETS: 232 10*3/uL (ref 145–400)
RBC: 4.55 10*6/uL (ref 3.70–5.45)
RDW: 13.6 % (ref 11.2–14.5)
WBC: 7.6 10*3/uL (ref 3.9–10.3)

## 2017-03-20 LAB — COMPREHENSIVE METABOLIC PANEL
ALK PHOS: 81 U/L (ref 40–150)
ALT: 16 U/L (ref 0–55)
ANION GAP: 10 meq/L (ref 3–11)
AST: 12 U/L (ref 5–34)
Albumin: 3.8 g/dL (ref 3.5–5.0)
BILIRUBIN TOTAL: 0.47 mg/dL (ref 0.20–1.20)
BUN: 12.5 mg/dL (ref 7.0–26.0)
CALCIUM: 10.2 mg/dL (ref 8.4–10.4)
CO2: 25 meq/L (ref 22–29)
CREATININE: 0.9 mg/dL (ref 0.6–1.1)
Chloride: 106 mEq/L (ref 98–109)
EGFR: 68 mL/min/{1.73_m2} — ABNORMAL LOW (ref >90)
Glucose: 87 mg/dl (ref 70–140)
Potassium: 4.3 mEq/L (ref 3.5–5.1)
Sodium: 141 mEq/L (ref 136–145)
TOTAL PROTEIN: 7.5 g/dL (ref 6.4–8.3)

## 2017-03-20 LAB — LACTATE DEHYDROGENASE: LDH: 138 U/L (ref 125–245)

## 2017-03-20 NOTE — Progress Notes (Signed)
Hematology and Oncology Follow Up Visit  Kaitlyn Martinez [id number] 01/17/1956 61 y.o. 03/20/2017 8:18 AM    Principle Diagnosis: 61 year old with:  1. Superficial spreading melanoma stage I with 1.9-mm Breslow measurements and Clark level of II. Diagnosed 01/2012. 2. Left-sided breast cancer diagnosed in 2005. She is status post lumpectomy, radiation and 5 years of hormone therapy.  Prior Therapy: She is S/P a wide excision by Dr. Brantley Stage. That was performed on Mar 07, 2012. The pathology from that procedure, case number [id number], showed a residual superficial spreading melanoma, 1.9 mm in the greatest depth, tumor confined to the papillary dermis, which was Clark level II. Margins are negative. The lymph nodes of the left axilla, 2 sentinel lymph nodes were identified; none of them had any malignancy.   Current therapy: Observation and follow up.   Interim History: [name] presents today for a follow up visit. Since her last visit, she reports no changes in her health. She continues to eat well without any weight loss or gross satiety. She didn't report periodic lower abdominal pain which is not different from her complaints last year. Her workup at that time was unrevealing including CT scan and a colonoscopy. She has not reported any skin lesions or rashes. She denied any lymphadenopathy or masses. She continues to perform activities of daily living without any hindrance or decline.    She does not report any headaches, blurry vision, syncope or seizures. She does not report any fevers, chills, sweats. She does not report any shortness of breath, cough or hemoptysis. She does not report any nausea, vomiting, abdominal pain she does report occasional constipation. She does not report any frequency urgency or hesitancy. Remaining review of systems unremarkable.  Medications: I have reviewed the patient's current medications. Current Outpatient Prescriptions  Medication Sig Dispense  Refill  . cetirizine (ZYRTEC) 10 MG chewable tablet Chew 10 mg by mouth daily.    Marland Kitchen ibuprofen (ADVIL,MOTRIN) 200 MG tablet Take 200 mg by mouth every 6 (six) hours as needed.    Marland Kitchen ibuprofen (ADVIL,MOTRIN) 800 MG tablet Take 1 tablet (800 mg total) by mouth every 8 (eight) hours as needed. (Patient not taking: Reported on 03/24/2015) 21 tablet 0  . Multiple Vitamin (MULTIVITAMIN WITH MINERALS) TABS Take 1 tablet by mouth every morning.    Marland Kitchen oxyCODONE-acetaminophen (PERCOCET/ROXICET) 5-325 MG per tablet Take 1 tablet by mouth every 6 (six) hours as needed for severe pain. (Patient not taking: Reported on 03/24/2015) 15 tablet 0   No current facility-administered medications for this visit.     Allergies:  Allergies  Allergen Reactions  . Codeine Anaphylaxis  . Penicillins Anaphylaxis  . Codeine   . Penicillins     Past Medical History, Surgical history, Social history, and Family History were reviewed and updated.   Physical Exam: Blood pressure 110/62, pulse 63, temperature 98.7 F (37.1 C), temperature source Oral, resp. rate 18, height 5\' 8"  (1.727 m), weight 196 lb 6.4 oz (89.1 kg), SpO2 100 %. ECOG: 1 General appearance: Alert, awake woman without distress. Head: Normocephalic, without obvious abnormality no oral thrush or ulcers Neck: no adenopathy Lymph nodes: Cervical, supraclavicular, and axillary nodes normal. Heart:regular rate and rhythm, S1, S2 normal, no murmur, click, rub or gallop Lung:chest clear, no wheezing, rales, normal symmetric air entry no dullness to percussion. Abdomin: soft, non-tender, without masses or organomegaly no rebound or guarding. EXT:no erythema, induration, or nodules Skin examination: No evidence of any lesions or abnormalities.  Lab Results: Lab  Results  Component Value Date   WBC 7.6 03/20/2017   HGB 13.1 03/20/2017   HCT 41.0 03/20/2017   MCV 90.1 03/20/2017   PLT 232 03/20/2017     Chemistry      Component Value Date/Time   NA  139 03/21/2016 0745   K 4.4 03/21/2016 0745   CL 107 03/18/2013 0941   CO2 25 03/21/2016 0745   BUN 11.3 03/21/2016 0745   CREATININE 0.8 03/21/2016 0745      Component Value Date/Time   CALCIUM 10.0 03/21/2016 0745   ALKPHOS 71 03/21/2016 0745   AST 14 03/21/2016 0745   ALT 21 03/21/2016 0745   BILITOT 0.43 03/21/2016 0745       Impression and Plan:  61 year old woman with the following issues:  1. Superficial spreading melanoma. Her final staging is stage I with 1.9-mm Breslow measurements and Clark level of II. She has 2 sentinel lymph nodes that were negative. This was diagnosed in 2013.  She is over 5 years out from her diagnosis without any evidence of recurrent disease. I recommended that continued lifetime surveillance with dermatology. No further oncology follow-up is needed at this time.   2. Breast cancer. Diagnosed in 2005 without any evidence of recurrence. She is up-to-date on her mammography which is done annually.  3. Abdominal pain: Workup is ongoing and colonoscopies pending. CT scan on 03/15/2016 was normal.  4. Follow-up: I'm happy to see her in the future as needed.   [facility], MD 6/12/20188:18 AM

## 2017-09-13 ENCOUNTER — Other Ambulatory Visit: Payer: Self-pay

## 2017-09-13 DIAGNOSIS — L570 Actinic keratosis: Secondary | ICD-10-CM | POA: Diagnosis not present

## 2017-09-13 DIAGNOSIS — L819 Disorder of pigmentation, unspecified: Secondary | ICD-10-CM | POA: Diagnosis not present

## 2017-09-13 DIAGNOSIS — L821 Other seborrheic keratosis: Secondary | ICD-10-CM | POA: Diagnosis not present

## 2017-10-10 DIAGNOSIS — L570 Actinic keratosis: Secondary | ICD-10-CM | POA: Diagnosis not present

## 2017-10-10 DIAGNOSIS — B0229 Other postherpetic nervous system involvement: Secondary | ICD-10-CM | POA: Diagnosis not present

## 2017-10-17 DIAGNOSIS — R238 Other skin changes: Secondary | ICD-10-CM | POA: Diagnosis not present

## 2018-08-06 DIAGNOSIS — B009 Herpesviral infection, unspecified: Secondary | ICD-10-CM | POA: Diagnosis not present

## 2018-08-06 DIAGNOSIS — L570 Actinic keratosis: Secondary | ICD-10-CM | POA: Diagnosis not present

## 2018-11-18 DIAGNOSIS — J189 Pneumonia, unspecified organism: Secondary | ICD-10-CM | POA: Diagnosis not present

## 2018-11-21 DIAGNOSIS — R05 Cough: Secondary | ICD-10-CM | POA: Diagnosis not present

## 2020-04-06 DIAGNOSIS — M160 Bilateral primary osteoarthritis of hip: Secondary | ICD-10-CM | POA: Diagnosis not present

## 2020-04-30 DIAGNOSIS — M25551 Pain in right hip: Secondary | ICD-10-CM | POA: Diagnosis not present

## 2020-04-30 DIAGNOSIS — M25552 Pain in left hip: Secondary | ICD-10-CM | POA: Diagnosis not present

## 2020-04-30 DIAGNOSIS — M545 Low back pain: Secondary | ICD-10-CM | POA: Diagnosis not present

## 2020-05-10 ENCOUNTER — Encounter: Payer: Self-pay | Admitting: Physical Therapy

## 2020-05-10 ENCOUNTER — Other Ambulatory Visit: Payer: Self-pay

## 2020-05-10 ENCOUNTER — Ambulatory Visit: Payer: BC Managed Care – PPO | Attending: Family Medicine | Admitting: Physical Therapy

## 2020-05-10 DIAGNOSIS — M25551 Pain in right hip: Secondary | ICD-10-CM | POA: Insufficient documentation

## 2020-05-10 DIAGNOSIS — M25552 Pain in left hip: Secondary | ICD-10-CM | POA: Diagnosis not present

## 2020-05-10 DIAGNOSIS — M545 Low back pain, unspecified: Secondary | ICD-10-CM

## 2020-05-10 NOTE — Therapy (Signed)
[facility] [place], [place], [address] Phone: [phone]   Fax:  [phone]  Physical Therapy Evaluation  Patient Details  Name: ANALUCIA HUSH MRN: [id number] Date of Birth: [birth year] Referring Provider (PT): Rhina Brackett MD.   Encounter Date: 05/10/2020   PT End of Session - 05/10/20 1227    Visit Number 1    Number of Visits 12    Date for PT Re-Evaluation 06/21/20    PT Start Time 1118    PT Stop Time 1209    PT Time Calculation (min) 51 min    Activity Tolerance Patient tolerated treatment well    Behavior During Therapy [facility] for tasks assessed/performed           Past Medical History:  Diagnosis Date  . Arthritis   . Breast cancer ([place]) 2000  . Cancer ([place]) 2013   Left forearm  . Cancer ([place]) 2000   breast  . Melanoma ([place])     Past Surgical History:  Procedure Laterality Date  . BREAST SURGERY  2000   Lumpectomy-axillary node dissection  . FRACTURE SURGERY  2005   fx rt lower leg  . i&D axilla  03/07/12   Lt arm axilla  . LEG SURGERY    . lumpectomy    . MELANOMA EXCISION  03/07/12   lt forearm  . TUBAL LIGATION      There were no vitals filed for this visit.    Subjective Assessment - 05/10/20 1232    Subjective COVID-19 screen performed prior to patient entering clinic.  The patient presents to the clinic today with c/o low back and bilateral hip pain that has been ongoing for the last 4 months.  Her pain-level today is a 5/10.  She states that a recent prescription of Meloxicam as been very helpful.  She currently has to sleep in her recliner due to pain.    Pertinent History CA, CHF, asthma, DM, HTN, Fx of right lower leg, lumpectomy.    How long can you sit comfortably? Varies.    How long can you stand comfortably? Varies.    How long can you walk comfortably? Varies.    Patient Stated Goals Get out of pain.    Currently in Pain? Yes    Pain Score 5     Pain Location Back    Both hips.   Pain Orientation Right;Left    Pain Descriptors / Indicators Aching    Pain Onset More than a month ago    Pain Frequency Constant              [facility] PT Assessment - 05/10/20 0001      Assessment   Medical Diagnosis LBP, SIJ pain, hip bursitis and hip abductor tendinopathy.    Referring Provider (PT) Rhina Brackett MD.    Onset Date/Surgical Date --   ~4 months.     Precautions   Precautions None      Restrictions   Weight Bearing Restrictions No      Balance Screen   Has the patient fallen in the past 6 months No    Has the patient had a decrease in activity level because of a fear of falling?  No    Is the patient reluctant to leave their home because of a fear of falling?  No      Home Ecologist residence      Prior Function   Level of Independence Independent  Posture/Postural Control   Posture/Postural Control No significant limitations      Deep Tendon Reflexes   DTR Assessment Site Patella;Achilles    Patella DTR 1+    Achilles DTR 0      ROM / Strength   AROM / PROM / Strength AROM;Strength      AROM   Overall AROM Comments Full active lumbar flexion and extension.  Normal bilateral hip range of motion.      Strength   Overall Strength Comments Normal bilateral LE strength with the exception of bilateral hip abduction which is graded at 4/5 likely decreased due to pain.      Palpation   Palpation comment Tender at L5-S1 and across bilaterally from this region and tender over both SIJ's right > left.  She is tender to palption over both glut meds and TFL as well as over and around her greater trochanters.      Special Tests   Other special tests (-) SLR, (-) FABER testing.  (=) leg lengths.  Patient states she would most likely had pain during special testing had it not been for Meloxicam.        Ambulation/Gait   Gait Comments WNL.                      Objective measurements  completed on examination: See above findings.       [facility] Adult PT Treatment/Exercise - 05/10/20 0001      Modalities   Modalities Electrical Stimulation      Electrical Stimulation   Electrical Stimulation Location Lower lumbar     Electrical Stimulation Action IFC    Electrical Stimulation Parameters 80-150 Hz x 20 minutes at 100% scan.    Electrical Stimulation Goals Pain                       PT Long Term Goals - 05/10/20 1249      PT LONG TERM GOAL #1   Title Independent with a HEP.    Time 6    Period Weeks    Status New      PT LONG TERM GOAL #2   Title Perform ADL's with pain not > 2-3/10.                  Plan - 05/10/20 1242    Clinical Impression Statement The patient presents to O[facility] with c/o low back and bilateral hip pain.  The pain has been ongoing for about 4 months.  She c/o pain bilateral over both SIJ's right > left and generally at L5-S1.  Both her hip abductors and trochanteric regions are tender to palpation.  Pain is certainly limited her functional mobility.  Special testing was negative today though she reports Meloxicam has been very helpful.  Patient will benefit from skilled physical therapy intervention to address deficits and pain.    Personal Factors and Comorbidities Comorbidity 1;Comorbidity 3+;Comorbidity 2    Comorbidities CA, CHF, asthma, DM, HTN, Fx of right lower leg, lumpectomy.    Examination-Activity Limitations Other    Examination-Participation Restrictions Other    Stability/Clinical Decision Making Evolving/Moderate complexity    Clinical Decision Making Low    Rehab Potential Excellent    PT Frequency 2x / week    PT Duration 6 weeks    PT Treatment/Interventions ADLs/Self Care Home Management;Cryotherapy;Electrical Stimulation;Ultrasound;Moist Heat;Iontophoresis 4mg /ml Dexamethasone;Functional mobility training;Therapeutic activities;Therapeutic exercise;Manual techniques;Patient/family education;Passive  range of motion;Dry needling;Joint Manipulations;Spinal Manipulations  PT Next Visit Plan Draw-in progression, S and DKTC, hip bridges.  STW/M to bilateral low back and hips.  Hip abductor stretching.  Modalites as needed.    Consulted and Agree with Plan of Care Patient           Patient will benefit from skilled therapeutic intervention in order to improve the following deficits and impairments:  Pain, Decreased activity tolerance  Visit Diagnosis: Acute right-sided low back pain without sciatica - Plan: PT plan of care cert/re-cert  Pain in left hip - Plan: PT plan of care cert/re-cert  Pain in right hip - Plan: PT plan of care cert/re-cert     Problem List There are no problems to display for this patient.   Santiago Graf, [place] MPT 05/10/2020, 12:52 PM  [facility] [address] [place], [place], [address] Phone: [phone]   Fax:  [phone]  Name: MILLER EDGINGTON MRN: [id number] Date of Birth: November 10, 1955

## 2020-05-11 ENCOUNTER — Ambulatory Visit: Payer: BC Managed Care – PPO | Admitting: *Deleted

## 2020-05-11 DIAGNOSIS — M545 Low back pain, unspecified: Secondary | ICD-10-CM

## 2020-05-11 DIAGNOSIS — M25552 Pain in left hip: Secondary | ICD-10-CM | POA: Diagnosis not present

## 2020-05-11 DIAGNOSIS — M25551 Pain in right hip: Secondary | ICD-10-CM | POA: Diagnosis not present

## 2020-05-11 NOTE — Therapy (Signed)
[facility] [place], [place], [address] Phone: [phone]   Fax:  [phone]  Physical Therapy Treatment  Patient Details  Name: WILSON SAMPLE MRN: [id number] Date of Birth: 29-Apr-1956 Referring Provider (PT): Rhina Brackett MD.   Encounter Date: 05/11/2020   PT End of Session - 05/11/20 1525    Visit Number 2    Number of Visits 12    Date for PT Re-Evaluation 06/21/20    PT Start Time 6948    PT Stop Time 1435    PT Time Calculation (min) 50 min           Past Medical History:  Diagnosis Date   Arthritis    Breast cancer ([place]) 2000   Cancer ([place]) 2013   Left forearm   Cancer ([place]) 2000   breast   Melanoma ([place])     Past Surgical History:  Procedure Laterality Date   BREAST SURGERY  2000   Lumpectomy-axillary node dissection   FRACTURE SURGERY  2005   fx rt lower leg   i&D axilla  03/07/12   Lt arm axilla   LEG SURGERY     lumpectomy     MELANOMA EXCISION  03/07/12   lt forearm   TUBAL LIGATION      There were no vitals filed for this visit.   Subjective Assessment - 05/11/20 1349    Subjective COVID-19 screen performed prior to patient entering clinic. 6/10 pain today    Pertinent History CA, CHF, asthma, DM, HTN, Fx of right lower leg, lumpectomy.    How long can you sit comfortably? Varies.    How long can you stand comfortably? Varies.    How long can you walk comfortably? Varies.    Currently in Pain? Yes    Pain Score 6     Pain Location Back    Pain Orientation Right;Left    Pain Descriptors / Indicators Aching;Sore    Pain Onset More than a month ago                             [facility] Adult PT Treatment/Exercise - 05/11/20 0001      Exercises   Exercises Lumbar;Knee/Hip      Lumbar Exercises: Aerobic   Nustep L3 seat 10 x 10 mins      Lumbar Exercises: Supine   Ab Set 10 reps;5 seconds    Bent Knee Raise 20 reps;3 seconds    Bridge 10  reps;3 seconds      Lumbar Exercises: Sidelying   Hip Abduction Both;20 reps      Knee/Hip Exercises: Stretches   Piriformis Stretch Both;3 reps;30 seconds      Modalities   Modalities Teacher, English as a foreign language Location Lower lumbar     Electrical Stimulation Action IFC    Electrical Stimulation Parameters 80-150hz  x 15 mins    Electrical Stimulation Goals Pain                       PT Long Term Goals - 05/10/20 1249      PT LONG TERM GOAL #1   Title Independent with a HEP.    Time 6    Period Weeks    Status New      PT LONG TERM GOAL #2   Title Perform ADL's with pain not > 2-3/10.  Plan - 05/11/20 1350    Personal Factors and Comorbidities Comorbidity 1;Comorbidity 3+;Comorbidity 2    Comorbidities CA, CHF, asthma, DM, HTN, Fx of right lower leg, lumpectomy.    Examination-Participation Restrictions Other    Stability/Clinical Decision Making Evolving/Moderate complexity    Rehab Potential Excellent    PT Frequency 2x / week    PT Duration 6 weeks    PT Treatment/Interventions ADLs/Self Care Home Management;Cryotherapy;Electrical Stimulation;Ultrasound;Moist Heat;Iontophoresis 4mg /ml Dexamethasone;Functional mobility training;Therapeutic activities;Therapeutic exercise;Manual techniques;Patient/family education;Passive range of motion;Dry needling;Joint Manipulations;Spinal Manipulations    PT Next Visit Plan Draw-in progression, S and DKTC, hip bridges.  STW/M to bilateral low back and hips.  Hip abductor stretching.  Modalites as needed.           Patient will benefit from skilled therapeutic intervention in order to improve the following deficits and impairments:  Pain, Decreased activity tolerance  Visit Diagnosis: Acute right-sided low back pain without sciatica  Pain in left hip  Pain in right hip     Problem List There are no problems to display for this  patient.   Kitt Ledet,CHRIS, PTA 05/11/2020, 6:17 PM  [facility] [address] [place], [place], [address] Phone: [phone]   Fax:  [phone]  Name: SHAILAH GIBBINS MRN: [id number] Date of Birth: [birth year]

## 2020-05-17 ENCOUNTER — Other Ambulatory Visit: Payer: Self-pay

## 2020-05-17 ENCOUNTER — Encounter: Payer: Self-pay | Admitting: Physical Therapy

## 2020-05-17 ENCOUNTER — Ambulatory Visit: Payer: BC Managed Care – PPO | Admitting: Physical Therapy

## 2020-05-17 DIAGNOSIS — M25551 Pain in right hip: Secondary | ICD-10-CM | POA: Diagnosis not present

## 2020-05-17 DIAGNOSIS — M25552 Pain in left hip: Secondary | ICD-10-CM

## 2020-05-17 DIAGNOSIS — M545 Low back pain, unspecified: Secondary | ICD-10-CM

## 2020-05-17 NOTE — Therapy (Signed)
[facility] [place], [place], [address] Phone: [phone]   Fax:  [phone]  Physical Therapy Treatment  Patient Details  Name: Kaitlyn Martinez MRN: [id number] Date of Birth: 1956-01-09 Referring Provider (PT): Rhina Brackett MD.   Encounter Date: 05/17/2020   PT End of Session - 05/17/20 1440    Visit Number 3    Number of Visits 12    Date for PT Re-Evaluation 06/21/20    PT Start Time 3016    PT Stop Time 1436    PT Time Calculation (min) 51 min    Activity Tolerance Patient tolerated treatment well    Behavior During Therapy [facility] for tasks assessed/performed           Past Medical History:  Diagnosis Date  . Arthritis   . Breast cancer ([place]) 2000  . Cancer ([place]) 2013   Left forearm  . Cancer ([place]) 2000   breast  . Melanoma ([place])     Past Surgical History:  Procedure Laterality Date  . BREAST SURGERY  2000   Lumpectomy-axillary node dissection  . FRACTURE SURGERY  2005   fx rt lower leg  . i&D axilla  03/07/12   Lt arm axilla  . LEG SURGERY    . lumpectomy    . MELANOMA EXCISION  03/07/12   lt forearm  . TUBAL LIGATION      There were no vitals filed for this visit.   Subjective Assessment - 05/17/20 1357    Subjective COVID-19 screen performed prior to patient entering clinic. Pt reporting 5/10 pain in low back and both hips but states her left is the worse.    Pertinent History CA, CHF, asthma, DM, HTN, Fx of right lower leg, lumpectomy.    How long can you sit comfortably? Varies.    How long can you stand comfortably? Varies.    How long can you walk comfortably? Varies.    Patient Stated Goals Get out of pain.    Currently in Pain? Yes    Pain Score 5     Pain Location Back    Pain Orientation Left;Right    Pain Descriptors / Indicators Aching;Sore    Pain Type Chronic pain    Pain Onset More than a month ago    Aggravating Factors  standing makes pain worse    Pain Relieving  Factors nothing                             [facility] Adult PT Treatment/Exercise - 05/17/20 0001      Exercises   Exercises Lumbar;Knee/Hip      Lumbar Exercises: Aerobic   Nustep L4 seat 10 x 10 mins      Lumbar Exercises: Standing   Heel Raises 15 reps    Heel Raises Limitations Toe raises x 15      Lumbar Exercises: Supine   Ab Set 10 reps;5 seconds    Bent Knee Raise 20 reps;3 seconds    Bridge 10 reps;5 seconds      Lumbar Exercises: Sidelying   Hip Abduction Both;20 reps      Knee/Hip Exercises: Stretches   Piriformis Stretch Both;3 reps;30 seconds    Other Knee/Hip Stretches trunk rotation: x 2 to each side hodling 30 seconds      Modalities   Modalities Teacher, English as a foreign language Location Lower lumbar  Electrical Stimulation Action pre-mod    Electrical Stimulation Parameters 80-150 Hz x 15 minutes    Electrical Stimulation Goals Pain                       PT Long Term Goals - 05/17/20 1544      PT LONG TERM GOAL #1   Title Independent with a HEP.    Status On-going      PT LONG TERM GOAL #2   Title Perform ADL's with pain not > 2-3/10.    Status On-going                 Plan - 05/17/20 1442    Clinical Impression Statement Pt arriving to therapy reproting low back pain and bilateral hip pain. Pt tolerating hip and low back exercises and stretching. Nomal tolerance to modalities. Continue skilled PT.    Personal Factors and Comorbidities Comorbidity 1;Comorbidity 3+;Comorbidity 2    Comorbidities CA, CHF, asthma, DM, HTN, Fx of right lower leg, lumpectomy.    Examination-Activity Limitations Other    Examination-Participation Restrictions Other    Stability/Clinical Decision Making Evolving/Moderate complexity    Rehab Potential Excellent    PT Frequency 2x / week    PT Duration 6 weeks    PT Treatment/Interventions ADLs/Self Care Home  Management;Cryotherapy;Electrical Stimulation;Ultrasound;Moist Heat;Iontophoresis 4mg /ml Dexamethasone;Functional mobility training;Therapeutic activities;Therapeutic exercise;Manual techniques;Patient/family education;Passive range of motion;Dry needling;Joint Manipulations;Spinal Manipulations    PT Next Visit Plan Draw-in progression, S and DKTC, hip bridges.  STW/M to bilateral low back and hips.  Hip abductor stretching.  Modalites as needed.    Consulted and Agree with Plan of Care Patient           Patient will benefit from skilled therapeutic intervention in order to improve the following deficits and impairments:  Pain, Decreased activity tolerance  Visit Diagnosis: Acute right-sided low back pain without sciatica  Pain in left hip  Pain in right hip     Problem List There are no problems to display for this patient.   Kaitlyn Martinez, PT, MPT 05/17/2020, 3:47 PM  [facility] [place], [place], [address] Phone: [phone]   Fax:  [phone]  Name: Kaitlyn Martinez MRN: [id number] Date of Birth: 10/31/1955

## 2020-05-18 ENCOUNTER — Ambulatory Visit: Payer: BC Managed Care – PPO | Admitting: Physical Therapy

## 2020-05-18 ENCOUNTER — Encounter: Payer: Self-pay | Admitting: Physical Therapy

## 2020-05-18 DIAGNOSIS — M545 Low back pain, unspecified: Secondary | ICD-10-CM

## 2020-05-18 DIAGNOSIS — M25552 Pain in left hip: Secondary | ICD-10-CM

## 2020-05-18 DIAGNOSIS — M25551 Pain in right hip: Secondary | ICD-10-CM

## 2020-05-18 NOTE — Therapy (Signed)
[facility] [place], [place], [address] Phone: [phone]   Fax:  [phone]  Physical Therapy Treatment  Patient Details  Name: Kaitlyn Martinez MRN: [id number] Date of Birth: September 15, 1956 Referring Provider (PT): Rhina Brackett MD.   Encounter Date: 05/18/2020   PT End of Session - 05/18/20 1530    Visit Number 4    Number of Visits 12    Date for PT Re-Evaluation 06/21/20    PT Start Time 5681    PT Stop Time 1515    PT Time Calculation (min) 42 min    Activity Tolerance Patient tolerated treatment well    Behavior During Therapy [facility] for tasks assessed/performed           Past Medical History:  Diagnosis Date  . Arthritis   . Breast cancer ([place]) 2000  . Cancer ([place]) 2013   Left forearm  . Cancer ([place]) 2000   breast  . Melanoma ([place])     Past Surgical History:  Procedure Laterality Date  . BREAST SURGERY  2000   Lumpectomy-axillary node dissection  . FRACTURE SURGERY  2005   fx rt lower leg  . i&D axilla  03/07/12   Lt arm axilla  . LEG SURGERY    . lumpectomy    . MELANOMA EXCISION  03/07/12   lt forearm  . TUBAL LIGATION      There were no vitals filed for this visit.   Subjective Assessment - 05/18/20 1528    Subjective COVID-19 screen performed prior to patient entering clinic. Reports she woke with more soreness and achiness this morning.    Pertinent History CA, CHF, asthma, DM, HTN, Fx of right lower leg, lumpectomy.    How long can you sit comfortably? Varies.    How long can you stand comfortably? Varies.    How long can you walk comfortably? Varies.    Patient Stated Goals Get out of pain.    Currently in Pain? Yes    Pain Score 4     Pain Location Back    Pain Orientation Left;Right;Lower    Pain Descriptors / Indicators Sore;Aching    Pain Type Chronic pain    Pain Onset More than a month ago    Pain Frequency Constant              [facility] PT Assessment - 05/18/20 0001       Assessment   Medical Diagnosis LBP, SIJ pain, hip bursitis and hip abductor tendinopathy.    Referring Provider (PT) Rhina Brackett MD.    Next MD Visit 05/26/2020      Precautions   Precautions None      Restrictions   Weight Bearing Restrictions No                         [facility] Adult PT Treatment/Exercise - 05/18/20 0001      Modalities   Modalities Electrical Stimulation;Ultrasound      Electrical Stimulation   Electrical Stimulation Location B low back/ superior glute    Electrical Stimulation Action IFC    Electrical Stimulation Parameters 80-150 hz x15 min    Electrical Stimulation Goals Pain      Ultrasound   Ultrasound Location L low back, superior glute    Ultrasound Parameters Combo 1.5 w/cm2, 100%, 1 mhz x10 mi    Ultrasound Goals Pain      Manual Therapy   Manual Therapy Soft tissue mobilization  Soft tissue mobilization STW to L lumbar paraspinals, QL, superior glute to reduce pain and tone                       PT Long Term Goals - 05/17/20 1544      PT LONG TERM GOAL #1   Title Independent with a HEP.    Status On-going      PT LONG TERM GOAL #2   Title Perform ADL's with pain not > 2-3/10.    Status On-going                 Plan - 05/18/20 1530    Clinical Impression Statement Patient presented in clinic with reports of more ache and soreness in low back since waking this morning. Patient compliant with HEP at home per patient report. Tenderness reported by patient over L superior glute and more tone palpable there as well. No other negetive complaints were reported during treatment. Patient encouraged to continue HEP as directed and assessment of symptoms will dictate course of treatment. Normal modalities response noted following removal of the modalities.    Personal Factors and Comorbidities Comorbidity 1;Comorbidity 3+;Comorbidity 2    Comorbidities CA, CHF, asthma, DM, HTN, Fx of right lower leg,  lumpectomy.    Examination-Activity Limitations Other    Examination-Participation Restrictions Other    Stability/Clinical Decision Making Evolving/Moderate complexity    Rehab Potential Excellent    PT Frequency 2x / week    PT Duration 6 weeks    PT Treatment/Interventions ADLs/Self Care Home Management;Cryotherapy;Electrical Stimulation;Ultrasound;Moist Heat;Iontophoresis 4mg /ml Dexamethasone;Functional mobility training;Therapeutic activities;Therapeutic exercise;Manual techniques;Patient/family education;Passive range of motion;Dry needling;Joint Manipulations;Spinal Manipulations    PT Next Visit Plan Assess based on symptoms per protocol.    Consulted and Agree with Plan of Care Patient           Patient will benefit from skilled therapeutic intervention in order to improve the following deficits and impairments:  Pain, Decreased activity tolerance  Visit Diagnosis: Acute right-sided low back pain without sciatica  Pain in left hip  Pain in right hip     Problem List There are no problems to display for this patient.   Standley Brooking, PTA 05/18/2020, 3:46 PM  [facility] [address] [place], [place], [address] Phone: [phone]   Fax:  [phone]  Name: Kaitlyn Martinez MRN: [id number] Date of Birth: 05-09-1956

## 2020-05-24 ENCOUNTER — Encounter: Payer: Self-pay | Admitting: Physical Therapy

## 2020-05-24 ENCOUNTER — Ambulatory Visit: Payer: BC Managed Care – PPO | Admitting: Physical Therapy

## 2020-05-24 DIAGNOSIS — M25551 Pain in right hip: Secondary | ICD-10-CM | POA: Diagnosis not present

## 2020-05-24 DIAGNOSIS — M545 Low back pain, unspecified: Secondary | ICD-10-CM

## 2020-05-24 DIAGNOSIS — M25552 Pain in left hip: Secondary | ICD-10-CM | POA: Diagnosis not present

## 2020-05-24 NOTE — Therapy (Addendum)
[facility] [place], [place], [address] Phone: [phone]   Fax:  [phone]  Physical Therapy Treatment PHYSICAL THERAPY DISCHARGE SUMMARY  Visits from Start of Care: 5  Current functional level related to goals / functional outcomes: See  below   Remaining deficits: See goals   Education / Equipment: HEP Plan: Patient agrees to discharge.  Patient goals were not met. Patient is being discharged due to not returning since the last visit.  ?????  Gabriela Eves, PT, DPT   Patient Details  Name: Kaitlyn Martinez MRN: [id number] Date of Birth: [birth year] Referring Provider (PT): Rhina Brackett MD.   Encounter Date: 05/24/2020   PT End of Session - 05/24/20 1530    Visit Number 5    Number of Visits 12    Date for PT Re-Evaluation 06/21/20    PT Start Time 1430    PT Stop Time 1516    PT Time Calculation (min) 46 min    Activity Tolerance Patient tolerated treatment well    Behavior During Therapy [facility] for tasks assessed/performed           Past Medical History:  Diagnosis Date  . Arthritis   . Breast cancer ([place]) 2000  . Cancer ([place]) 2013   Left forearm  . Cancer ([place]) 2000   breast  . Melanoma ([place])     Past Surgical History:  Procedure Laterality Date  . BREAST SURGERY  2000   Lumpectomy-axillary node dissection  . FRACTURE SURGERY  2005   fx rt lower leg  . i&D axilla  03/07/12   Lt arm axilla  . LEG SURGERY    . lumpectomy    . MELANOMA EXCISION  03/07/12   lt forearm  . TUBAL LIGATION      There were no vitals filed for this visit.   Subjective Assessment - 05/24/20 1531    Subjective COVID-19 screen performed prior to patient entering clinic. Reports ongoing 5/10 pain in left low back    Pertinent History CA, CHF, asthma, DM, HTN, Fx of right lower leg, lumpectomy.    How long can you sit comfortably? Varies.    How long can you stand comfortably? Varies.    How long can  you walk comfortably? Varies.    Patient Stated Goals Get out of pain.    Currently in Pain? Yes    Pain Score 5     Pain Location Back    Pain Orientation Left;Lower    Pain Descriptors / Indicators Sore;Aching    Pain Type Chronic pain    Pain Onset More than a month ago    Pain Frequency Constant              [facility] PT Assessment - 05/24/20 0001      Assessment   Medical Diagnosis LBP, SIJ pain, hip bursitis and hip abductor tendinopathy.    Referring Provider (PT) Rhina Brackett MD.    Next MD Visit 05/26/2020      Precautions   Precautions None      Restrictions   Weight Bearing Restrictions No                         [facility] Adult PT Treatment/Exercise - 05/24/20 0001      Exercises   Exercises Lumbar;Knee/Hip      Modalities   Modalities Electrical Stimulation;Ultrasound      Electrical Stimulation   Electrical Stimulation Location B low  back/ superior glute    Electrical Stimulation Action IFC    Electrical Stimulation Parameters 80-150 hz x10    Electrical Stimulation Goals Pain      Ultrasound   Ultrasound Location L low back and upper glutes    Ultrasound Parameters combo 1.5 w/cm2, 100%, 34mz x10 mins    Ultrasound Goals Pain      Manual Therapy   Manual Therapy Soft tissue mobilization    Soft tissue mobilization STW to L lumbar paraspinals, QL, superior glute to reduce pain and tone                       PT Long Term Goals - 05/17/20 1544      PT LONG TERM GOAL #1   Title Independent with a HEP.    Status On-going      PT LONG TERM GOAL #2   Title Perform ADL's with pain not > 2-3/10.    Status On-going                 Plan - 05/24/20 1519    Clinical Impression Statement Patient responded fairly well to therapy session though with ongoing left low back pain. Patient right sidelying for combo US/E-stim and STW/M. No reports of increased pain throughout session. Patient and PT discussed assessing goals  next visit then to be placed on hold until MD follow up on 06/02/2020. Patient reported understanding and agreement. Normal response to modalities upon removal.    Personal Factors and Comorbidities Comorbidity 1;Comorbidity 3+;Comorbidity 2    Comorbidities CA, CHF, asthma, DM, HTN, Fx of right lower leg, lumpectomy.    Examination-Activity Limitations Other    Examination-Participation Restrictions Other    Stability/Clinical Decision Making Evolving/Moderate complexity    Clinical Decision Making Low    Rehab Potential Excellent    PT Frequency 2x / week    PT Duration 6 weeks    PT Treatment/Interventions ADLs/Self Care Home Management;Cryotherapy;Electrical Stimulation;Ultrasound;Moist Heat;Iontophoresis 474mml Dexamethasone;Functional mobility training;Therapeutic activities;Therapeutic exercise;Manual techniques;Patient/family education;Passive range of motion;Dry needling;Joint Manipulations;Spinal Manipulations    PT Next Visit Plan MD note for 06/02/2020, assess goals, hold until MD discretion.    Consulted and Agree with Plan of Care Patient           Patient will benefit from skilled therapeutic intervention in order to improve the following deficits and impairments:  Pain, Decreased activity tolerance  Visit Diagnosis: Acute right-sided low back pain without sciatica  Pain in left hip  Pain in right hip     Problem List There are no problems to display for this patient.   KrGabriela EvesPT, DPT 05/24/2020, 3:42 PM  [id number]: [phone] Fax:  [phone]: Kaitlyn BEDWELLRN: [id number] of Birth: [birth year]

## 2020-05-25 ENCOUNTER — Encounter: Payer: BC Managed Care – PPO | Admitting: Physical Therapy

## 2020-05-25 DIAGNOSIS — R61 Generalized hyperhidrosis: Secondary | ICD-10-CM | POA: Diagnosis not present

## 2020-05-25 DIAGNOSIS — Z20822 Contact with and (suspected) exposure to covid-19: Secondary | ICD-10-CM | POA: Diagnosis not present

## 2020-05-26 ENCOUNTER — Encounter (HOSPITAL_BASED_OUTPATIENT_CLINIC_OR_DEPARTMENT_OTHER): Payer: Self-pay

## 2020-05-26 ENCOUNTER — Other Ambulatory Visit: Payer: Self-pay

## 2020-05-26 ENCOUNTER — Emergency Department (HOSPITAL_BASED_OUTPATIENT_CLINIC_OR_DEPARTMENT_OTHER)
Admission: EM | Admit: 2020-05-26 | Discharge: 2020-05-26 | Disposition: A | Discharge status: Home / Self Care | Payer: BC Managed Care – PPO | Attending: Emergency Medicine | Admitting: Emergency Medicine

## 2020-05-26 DIAGNOSIS — C439 Malignant melanoma of skin, unspecified: Secondary | ICD-10-CM | POA: Diagnosis not present

## 2020-05-26 DIAGNOSIS — Z79899 Other long term (current) drug therapy: Secondary | ICD-10-CM | POA: Diagnosis not present

## 2020-05-26 DIAGNOSIS — Z9012 Acquired absence of left breast and nipple: Secondary | ICD-10-CM | POA: Diagnosis not present

## 2020-05-26 DIAGNOSIS — R61 Generalized hyperhidrosis: Secondary | ICD-10-CM | POA: Insufficient documentation

## 2020-05-26 DIAGNOSIS — F17210 Nicotine dependence, cigarettes, uncomplicated: Secondary | ICD-10-CM | POA: Diagnosis not present

## 2020-05-26 DIAGNOSIS — C50912 Malignant neoplasm of unspecified site of left female breast: Secondary | ICD-10-CM | POA: Insufficient documentation

## 2020-05-26 LAB — BASIC METABOLIC PANEL
Anion gap: 11 (ref 5–15)
BUN: 11 mg/dL (ref 8–23)
CO2: 25 mmol/L (ref 22–32)
Calcium: 9.7 mg/dL (ref 8.9–10.3)
Chloride: 99 mmol/L (ref 98–111)
Creatinine, Ser: 0.67 mg/dL (ref 0.44–1.00)
GFR calc Af Amer: >60 mL/min (ref >60)
GFR calc non Af Amer: >60 mL/min (ref >60)
Glucose, Bld: 99 mg/dL (ref 70–99)
Potassium: 4 mmol/L (ref 3.5–5.1)
Sodium: 135 mmol/L (ref 135–145)

## 2020-05-26 LAB — CBC WITH DIFFERENTIAL/PLATELET
Abs Immature Granulocytes: 0.05 10*3/uL (ref 0.00–0.07)
Basophils Absolute: 0.1 10*3/uL (ref 0.0–0.1)
Basophils Relative: 1 %
Eosinophils Absolute: 0.1 10*3/uL (ref 0.0–0.5)
Eosinophils Relative: 1 %
HCT: 37.7 % (ref 36.0–46.0)
Hemoglobin: 11.9 g/dL — ABNORMAL LOW (ref 12.0–15.0)
Immature Granulocytes: 0 %
Lymphocytes Relative: 25 %
Lymphs Abs: 3.2 10*3/uL (ref 0.7–4.0)
MCH: 27.5 pg (ref 26.0–34.0)
MCHC: 31.6 g/dL (ref 30.0–36.0)
MCV: 87.3 fL (ref 80.0–100.0)
Monocytes Absolute: 1.3 10*3/uL — ABNORMAL HIGH (ref 0.1–1.0)
Monocytes Relative: 10 %
Neutro Abs: 8 10*3/uL — ABNORMAL HIGH (ref 1.7–7.7)
Neutrophils Relative %: 63 %
Platelets: 376 10*3/uL (ref 150–400)
RBC: 4.32 MIL/uL (ref 3.87–5.11)
RDW: 12 % (ref 11.5–15.5)
WBC: 12.8 10*3/uL — ABNORMAL HIGH (ref 4.0–10.5)
nRBC: 0 % (ref 0.0–0.2)

## 2020-05-26 LAB — URINALYSIS, ROUTINE W REFLEX MICROSCOPIC
Bilirubin Urine: NEGATIVE
Glucose, UA: NEGATIVE mg/dL
Ketones, ur: NEGATIVE mg/dL
Leukocytes,Ua: NEGATIVE
Nitrite: NEGATIVE
Protein, ur: NEGATIVE mg/dL
Specific Gravity, Urine: <1.005 — ABNORMAL LOW (ref 1.005–1.030)
pH: 6 (ref 5.0–8.0)

## 2020-05-26 LAB — URINALYSIS, MICROSCOPIC (REFLEX)

## 2020-05-26 NOTE — Discharge Instructions (Addendum)
Please follow with your primary care regarding your visit today. You have lyme disease tests pending that will need to be reviewed by your primary care.  If you develop fever, or new or concerning symptoms, you can report back to the ED.

## 2020-05-26 NOTE — ED Triage Notes (Addendum)
Pt c/o night sweats x 4-5 weeks-was seen by PCP yesterday-had labs, CXR, covid test-was notified covid and CXR are negative and to come to ED "for infection somewhere"-pt denies pain-NAD-steady gait

## 2020-05-26 NOTE — ED Provider Notes (Signed)
[facility] EMERGENCY DEPARTMENT Provider Note   CSN: [id number] Arrival date & time: 05/26/20  1716     History Chief Complaint  Patient presents with  . Night Sweats    Kaitlyn Martinez is a 64 y.o. female w PMHx breast cancer and melanoma in remission, presenting to the ED with complaint of 5 weeks of night sweats. She states she has been waking in the middle of the night, drenched in sweat. She states she otherwise has been feeling well. She had labs, COVID test and CXR done yesterday by PCP - COVID and CXR were neg. She was instructed to report to the ED to evaluate for an unknown source of infection.   She has been going to physical therapy for some time now for b/l hip bursitis/tendonitis.  She denies cough, fever, new abdominal pain, urinary sx, new back pain, rash.  She denies hx of IVDU or hx of risky sexual behavior.  She does endorse history of a tick bite to her right thigh in April of this year. She reports small localized erythema to the site, no target lesions or full body rash. No recent travel or close contact with persons with tuberculosis. She states she went through menopause at least 15 years ago.  The history is provided by the patient.       Past Medical History:  Diagnosis Date  . Arthritis   . Breast cancer ([place]) 2000  . 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 Cancer ([place]) 2000   breast  . Melanoma ([place])     There are no problems to display for this patient.   Past Surgical History:  Procedure Laterality Date  . BREAST SURGERY  2000   Lumpectomy-axillary node dissection  . FRACTURE SURGERY  2005   fx rt lower leg  . i&D axilla  03/07/12   Lt arm axilla  . LEG SURGERY    . lumpectomy    . MELANOMA EXCISION  03/07/12   lt forearm  . TUBAL LIGATION       OB History   No obstetric history on file.     Family History  Problem Relation Age of Onset  . Heart disease Father     Social History   Tobacco Use  . Smoking status:  Current Every Day Smoker    Types: Cigarettes  . Smokeless tobacco: Never Used  Vaping Use  . Vaping Use: Never used  Substance Use Topics  . Alcohol use: Not Currently  . Drug use: No    Home Medications Prior to Admission medications   Medication Sig Start Date End Date Taking? Authorizing Provider  cetirizine (ZYRTEC) 10 MG chewable tablet Chew 10 mg by mouth daily.    [provider]  ibuprofen (ADVIL,MOTRIN) 200 MG tablet Take 200 mg by mouth every 6 (six) hours as needed.    [provider]  ibuprofen (ADVIL,MOTRIN) 800 MG tablet Take 1 tablet (800 mg total) by mouth every 8 (eight) hours as needed. Patient not taking: Reported on 03/24/2015 11/20/13   Dalia Heading, PA-C  MELOXICAM PO Take by mouth.    [provider]  Multiple Vitamin (MULTIVITAMIN WITH MINERALS) TABS Take 1 tablet by mouth every morning.    [provider]  oxyCODONE-acetaminophen (PERCOCET/ROXICET) 5-325 MG per tablet Take 1 tablet by mouth every 6 (six) hours as needed for severe pain. Patient not taking: Reported on 03/24/2015 11/20/13   Dalia Heading, PA-C    Allergies  Codeine, Penicillins, and Shellfish allergy  Review of Systems   Review of Systems  All other systems reviewed and are negative.   Physical Exam Updated Vital Signs BP 140/75 (BP Location: Right Arm)   Pulse 84   Temp 99.4 F (37.4 C) (Oral)   Resp 16   Ht 5\' 8"  (1.727 m)   Wt 82.1 kg   SpO2 99%   BMI 27.52 kg/m   Physical Exam Vitals and nursing note reviewed.  Constitutional:      General: She is not in acute distress.    Appearance: She is well-developed. She is not ill-appearing or toxic-appearing.  HENT:     Head: Normocephalic and atraumatic.  Eyes:     Conjunctiva/sclera: Conjunctivae normal.  Cardiovascular:     Rate and Rhythm: Normal rate and regular rhythm.  Pulmonary:     Effort: Pulmonary effort is normal. No respiratory distress.     Breath sounds: Normal  breath sounds.  Abdominal:     General: Bowel sounds are normal.     Palpations: Abdomen is soft.     Tenderness: There is no abdominal tenderness.  Skin:    General: Skin is warm.  Neurological:     Mental Status: She is alert.  Psychiatric:        Behavior: Behavior normal.     ED Results / Procedures / Treatments   Labs (all labs ordered are listed, but only abnormal results are displayed) Labs Reviewed  CBC WITH DIFFERENTIAL/PLATELET - Abnormal; Notable for the following components:      Result Value   WBC 12.8 (*)    Hemoglobin 11.9 (*)    Neutro Abs 8.0 (*)    Monocytes Absolute 1.3 (*)    All other components within normal limits  URINALYSIS, ROUTINE W REFLEX MICROSCOPIC - Abnormal; Notable for the following components:   Specific Gravity, Urine <1.005 (*)    Hgb urine dipstick SMALL (*)    All other components within normal limits  URINALYSIS, MICROSCOPIC (REFLEX) - Abnormal; Notable for the following components:   Bacteria, UA FEW (*)    All other components within normal limits  BASIC METABOLIC PANEL  B. BURGDORFI ANTIBODIES    EKG None  Radiology No results found.  Procedures Procedures (including critical care time)  Medications Ordered in ED Medications - No data to display  ED Course  I have reviewed the triage vital signs and the nursing notes.  Pertinent labs & imaging results that were available during my care of the patient were reviewed by me and considered in my medical decision making (see chart for details).    MDM Rules/Calculators/A&P                          Patient presenting to the ED for evaluation of 5 week history of night sweats.  She was evaluated by by her PCP yesterday with laboratory work, negative chest x-ray negative Covid test.  She was sent here for further evaluation of "infection."  Labs today are reassuring with negative urine, normal electrolytes and kidney function, UA is slightly elevated at 12.8.  Discussed  possible causes of night sweats with patient.  No obvious risk factors for tuberculosis or HIV.  No symptoms of active infection.  She does report uncomplicated tick bite in April that left small localized erythema, no systemic symptoms at this time.  No target lesion.  Will send the burgdorferi antibodies, given duration of time since tick  bite and onset of symptoms will hold on doxycycline at this time.  Recommend PCP follow results and determine appropriate plan.  There is no evidence of emergent pathology at this time, no additional work-up is indicated today in the ED.  Patient is agreeable with plan and instructed follow with PCP.  Patient discussed with Dr. Laverta Baltimore, who agrees with work-up and care plan at this time. Final Clinical Impression(s) / ED Diagnoses Final diagnoses:  Night sweats    Rx / DC Orders ED Discharge Orders    None       Arriana Lohmann, [place] N, PA-C 05/26/20 2259    Margette Fast, MD 05/31/20 1125

## 2020-05-29 LAB — B. BURGDORFI ANTIBODIES: B burgdorferi Ab IgG+IgM: <0.91 {ISR} (ref 0.00–0.90)

## 2020-06-02 DIAGNOSIS — M545 Low back pain: Secondary | ICD-10-CM | POA: Diagnosis not present

## 2020-06-02 DIAGNOSIS — M25552 Pain in left hip: Secondary | ICD-10-CM | POA: Diagnosis not present

## 2020-06-22 ENCOUNTER — Other Ambulatory Visit: Payer: Self-pay | Admitting: Family Medicine

## 2020-06-22 DIAGNOSIS — M25552 Pain in left hip: Secondary | ICD-10-CM

## 2020-06-22 DIAGNOSIS — M25551 Pain in right hip: Secondary | ICD-10-CM

## 2020-06-28 ENCOUNTER — Encounter: Payer: Self-pay | Admitting: Infectious Diseases

## 2020-06-28 ENCOUNTER — Ambulatory Visit: Payer: BC Managed Care – PPO | Admitting: Infectious Diseases

## 2020-06-28 ENCOUNTER — Other Ambulatory Visit: Payer: Self-pay

## 2020-06-28 VITALS — BP 110/69 | HR 65 | Temp 98.3°F | Wt 178.0 lb

## 2020-06-28 DIAGNOSIS — M25551 Pain in right hip: Secondary | ICD-10-CM | POA: Diagnosis not present

## 2020-06-28 DIAGNOSIS — M25552 Pain in left hip: Secondary | ICD-10-CM | POA: Diagnosis not present

## 2020-06-28 DIAGNOSIS — R61 Generalized hyperhidrosis: Secondary | ICD-10-CM

## 2020-06-28 DIAGNOSIS — M545 Low back pain, unspecified: Secondary | ICD-10-CM

## 2020-06-28 NOTE — Progress Notes (Signed)
[facility]                                                             [place], [place], [place], [address]                                                                  Phn. [phone]; Fax: [phone]                                                                             Date: 06/28/20  Reason for Referral: Night sweats  Referring Provider: Carolee Rota   Assessment Night Sweats/Chills H/o Breast Ca in remission  H/o Melanoma in Remission  Plan Will follow up MRI Lumbar spine. I have got in touch with Dr Rip Harbour to see  If MRI can be changed from [place] contrast to W contrast given some lumbar spinal tenderness and night sweats/chills  Orders Placed This Encounter  Procedures  . Blood culture (routine single)  . Hepatitis C antibody  . HIV Antibody (routine testing w rflx)  . QuantiFERON-TB Gold Plus  . TSH  . C-reactive protein  Follow up in 2 weeks   I spent greater than 60 minutes with the patient including greater than 50% of time in face to face counsel of the patient and in coordination of their care.    Rosiland Oz, MD [facility]  Office phone [phone] Fax no. [phone] ______________________________________________________________________________________________________________________  HPI: 64 Year old Caucasian Female with a PMH of Breast ca s/p Lumpectomy and radiation + tamoxifien ( on remission), h/o melanoma under remission per patient, OA who is referred for evaluation of night sweats. Patient has been following Dr Rip Harbour ( [place]) for bilateral hip pain 2/2 bursitis and back pain. She is also scheduled to have an MRI Lumbar spine done on 10/5 for evaluation of her back pain. She says the sweating is mostly at night and it is drenching to the extent it soaks up her  clothes when she wakes up. It is associated with chills but she denies having any febrile episodes. She denies any headaches, neck pain, blurry vision, ear or sinus pain or tooth pain. Has occasional nausea, denies any vomiting, abdominal pain, diarrhea and constipation. Denies any GU symptoms. Denies any joint pain/swelling or rashes. She has not traveled any where outside of [place] recently. Denies traveling outside of the country. Denies any contact with TB patients. Has a dog at home who is in good health. She has not got COVID and Flu vaccine and does not want to get it. Her appetite is good and she says she has lost approx 10lbs in the last 2 months. She denies any lumps/bumps in her body.  ROS: Constitutional: Negative for fever, chills, activity change, appetite change, fatigue HENT: Negative for congestion, sore throat, rhinorrhea, sneezing, trouble swallowing and sinus pressure.  Eyes: Negative for photophobia and visual disturbance.  Respiratory: Negative for cough, chest tightness, shortness of breath, wheezing and stridor.  Cardiovascular: Negative for chest pain, palpitations and leg swelling.  Gastrointestinal: Negative for vomiting, abdominal pain, diarrhea, constipation, blood in stool, abdominal distention and anal bleeding.  Genitourinary: Negative for dysuria, hematuria, flank pain and difficulty urinating.  Musculoskeletal: Negative for myalgias, joint swelling, arthralgias and gait problem.  Skin: Negative for color change, pallor, rash and wound.  Neurological: Negative for dizziness, tremors, weakness and light-headedness.  Hematological: Negative for adenopathy. Does not bruise/bleed easily.  Psychiatric/Behavioral: Negative for behavioral problems, confusion, sleep disturbance, dysphoric mood, decreased concentration and agitation.   Past Medical History:  Diagnosis Date  . Arthritis   . Breast cancer ([place]) 2000  . Cancer ([place]) 2013   Left forearm  . Cancer ([place])  2000   breast  . Melanoma ([place])    Past Surgical History:  Procedure Laterality Date  . BREAST SURGERY  2000   Lumpectomy-axillary node dissection  . FRACTURE SURGERY  2005   fx rt lower leg  . i&D axilla  03/07/12   Lt arm axilla  . LEG SURGERY    . lumpectomy    . MELANOMA EXCISION  03/07/12   lt forearm  . TUBAL LIGATION     Current Outpatient Medications on File Prior to Visit  Medication Sig Dispense Refill  . Multiple Vitamin (MULTIVITAMIN WITH MINERALS) TABS Take 1 tablet by mouth every morning.    . cetirizine (ZYRTEC) 10 MG chewable tablet Chew 10 mg by mouth daily. (Patient not taking: Reported on 06/28/2020)    . ibuprofen (ADVIL,MOTRIN) 200 MG tablet Take 200 mg by mouth every 6 (six) hours as needed. (Patient not taking: Reported on 06/28/2020)    . ibuprofen (ADVIL,MOTRIN) 800 MG tablet Take 1 tablet (800 mg total) by mouth every 8 (eight) hours as needed. (Patient not taking: Reported on 06/28/2020) 21 tablet 0  . MELOXICAM PO Take by mouth. (Patient not taking: Reported on 06/28/2020)    . oxyCODONE-acetaminophen (PERCOCET/ROXICET) 5-325 MG per tablet Take 1 tablet by mouth every 6 (six) hours as needed for severe pain. (Patient not taking: Reported on 03/24/2015) 15 tablet 0  . traMADol (ULTRAM) 50 MG tablet SMARTSIG:0.5-1 Tablet(s) By Mouth 2-3 Times Daily PRN     No current facility-administered medications on file prior to visit.   Allergies  Allergen Reactions  . Codeine Anaphylaxis  . Penicillins Anaphylaxis  . Shellfish Allergy    Social History   Socioeconomic History  . Marital status: Divorced    Spouse name: Not on file  . Number of children: Not on file  . Years of education: Not on file  . Highest education level: Not on file  Occupational History  . Not on file  Tobacco Use  . Smoking status: Current Every Day Smoker    Types: Cigarettes  . Smokeless tobacco: Never Used  Vaping Use  . Vaping Use: Never used  Substance and Sexual Activity  .  Alcohol use: Not Currently  . Drug use: No  . Sexual activity: Not on file  Other Topics Concern  . Not on file  Social History Narrative   ** Merged History Encounter **       Social Determinants of Health   Financial Resource Strain:   . Difficulty of Paying  Living Expenses: Not on file  Food Insecurity:   . Worried About Charity fundraiser in the Last Year: Not on file  . Ran Out of Food in the Last Year: Not on file  Transportation Needs:   . Lack of Transportation (Medical): Not on file  . Lack of Transportation (Non-Medical): Not on file  Physical Activity:   . Days of Exercise per Week: Not on file  . Minutes of Exercise per Session: Not on file  Stress:   . Feeling of Stress : Not on file  Social Connections:   . Frequency of Communication with Friends and Family: Not on file  . Frequency of Social Gatherings with Friends and Family: Not on file  . Attends Religious Services: Not on file  . Active Member of Clubs or Organizations: Not on file  . Attends Archivist Meetings: Not on file  . Marital Status: Not on file  Intimate Partner Violence:   . Fear of Current or Ex-Partner: Not on file  . Emotionally Abused: Not on file  . Physically Abused: Not on file  . Sexually Abused: Not on file   Breast Cancer-relatedfamily history is not on file.   Vitals BP 110/69   Pulse 65   Temp 98.3 F (36.8 C) (Oral)   Wt 178 lb (80.7 kg)   BMI 27.06 kg/m    Examination  General - not in acute distress, comfortably sitting in chair HEENT - PEERLA, no pallor and no icterus, dental caries+. No sinus tenderness Chest - b/l clear air entry, no additional sounds, no axiallary lymphadenopathy CVS- Normal s1s2, RRR Abdomen - Soft, Non tender , non distended Ext- no pedal edema Neuro: grossly normal Back - Lumbar spinal tenderness+ Psych : calm and cooperative   Recent labs CBC Latest Ref Rng & Units 05/26/2020 03/20/2017 03/21/2016  WBC 4.0 - 10.5 K/uL 12.8(H)  7.6 8.2  Hemoglobin 12.0 - 15.0 g/dL 11.9(L) 13.1 12.8  Hematocrit 36 - 46 % 37.7 41.0 39.1  Platelets 150 - 400 K/uL 376 232 231   CMP Latest Ref Rng & Units 05/26/2020 03/20/2017 03/21/2016  Glucose 70 - 99 mg/dL 99 87 84  BUN 8 - 23 mg/dL 11 12.5 11.3  Creatinine 0.44 - 1.00 mg/dL 0.67 0.9 0.8  Sodium 135 - 145 mmol/L 135 141 139  Potassium 3.5 - 5.1 mmol/L 4.0 4.3 4.4  Chloride 98 - 111 mmol/L 99 - -  CO2 22 - 32 mmol/L 25 25 25   Calcium 8.9 - 10.3 mg/dL 9.7 10.2 10.0  Total Protein 6.4 - 8.3 g/dL - 7.5 7.4  Total Bilirubin 0.20 - 1.20 mg/dL - 0.47 0.43  Alkaline Phos 40 - 150 U/L - 81 71  AST 5 - 34 U/L - 12 14  ALT 0 - 55 U/L - 16 21    All pertinent labs/Imagings/notes reviewed. All pertinent plain films and CT images have been personally visualized and interpreted; radiology reports have been reviewed. Decision making incorporated into the Impression / Recommendations.

## 2020-06-29 NOTE — Addendum Note (Signed)
Addended by: Rosiland Oz on: 06/29/2020 07:32 AM   Modules accepted: Orders

## 2020-06-29 NOTE — Addendum Note (Signed)
Addended by: Rosiland Oz on: 06/29/2020 02:41 PM   Modules accepted: Orders

## 2020-07-04 LAB — CULTURE, BLOOD (SINGLE)
MICRO NUMBER:: 10971874
Result:: NO GROWTH

## 2020-07-04 LAB — HEPATITIS C ANTIBODY
Hepatitis C Ab: NONREACTIVE
SIGNAL TO CUT-OFF: 0.04 (ref <1.00)

## 2020-07-04 LAB — TSH: TSH: 0.43 mIU/L (ref 0.40–4.50)

## 2020-07-04 LAB — HIV ANTIBODY (ROUTINE TESTING W REFLEX): HIV 1&2 Ab, 4th Generation: NONREACTIVE

## 2020-07-04 LAB — QUANTIFERON-TB GOLD PLUS
Mitogen-NIL: >10 IU/mL
NIL: 0.05 IU/mL
QuantiFERON-TB Gold Plus: NEGATIVE
TB1-NIL: 0.01 IU/mL
TB2-NIL: 0.01 IU/mL

## 2020-07-04 LAB — C-REACTIVE PROTEIN: CRP: 61.3 mg/L — ABNORMAL HIGH (ref <8.0)

## 2020-07-05 ENCOUNTER — Other Ambulatory Visit: Payer: Self-pay | Admitting: Infectious Diseases

## 2020-07-06 ENCOUNTER — Telehealth: Payer: Self-pay

## 2020-07-06 NOTE — Telephone Encounter (Signed)
Per [name] patient states she could not do the MRI any sooner than 07/13/20 due to her work schedule. Kaitlyn Martinez

## 2020-07-06 NOTE — Telephone Encounter (Signed)
Thank you so much

## 2020-07-06 NOTE — Telephone Encounter (Signed)
-----   Message from Rosiland Oz, MD sent at 07/05/2020  7:52 AM EDT ----- Regarding: MRI Her MRI lumbar spine w contrast is scheduled far out on 10/5. Could you please check the MRI Lumbar spine WO contrast  can be done earlier than that ( ordered initially by Dr Rip Harbour)? If they can do the MRI lumbar spine WO contrast earlier, please schedule it. Thanks.

## 2020-07-13 ENCOUNTER — Other Ambulatory Visit: Payer: Self-pay

## 2020-07-13 ENCOUNTER — Other Ambulatory Visit: Payer: BC Managed Care – PPO

## 2020-07-13 ENCOUNTER — Ambulatory Visit
Admission: RE | Admit: 2020-07-13 | Discharge: 2020-07-13 | Disposition: A | Discharge status: Home / Self Care | Payer: BC Managed Care – PPO | Source: Ambulatory Visit | Attending: Infectious Diseases | Admitting: Infectious Diseases

## 2020-07-13 DIAGNOSIS — N83202 Unspecified ovarian cyst, left side: Secondary | ICD-10-CM | POA: Diagnosis not present

## 2020-07-13 DIAGNOSIS — M25551 Pain in right hip: Secondary | ICD-10-CM | POA: Diagnosis not present

## 2020-07-13 DIAGNOSIS — M25552 Pain in left hip: Secondary | ICD-10-CM | POA: Diagnosis not present

## 2020-07-13 DIAGNOSIS — M5127 Other intervertebral disc displacement, lumbosacral region: Secondary | ICD-10-CM | POA: Diagnosis not present

## 2020-07-13 DIAGNOSIS — M533 Sacrococcygeal disorders, not elsewhere classified: Secondary | ICD-10-CM | POA: Diagnosis not present

## 2020-07-13 DIAGNOSIS — M48061 Spinal stenosis, lumbar region without neurogenic claudication: Secondary | ICD-10-CM | POA: Diagnosis not present

## 2020-07-13 MED ORDER — GADOBENATE DIMEGLUMINE 529 MG/ML IV SOLN
16.0000 mL | Freq: Once | INTRAVENOUS | Status: AC | PRN
  Administered 2020-07-13: 16 mL via INTRAVENOUS

## 2020-07-19 ENCOUNTER — Encounter: Payer: Self-pay | Admitting: Infectious Diseases

## 2020-07-19 ENCOUNTER — Ambulatory Visit: Payer: BC Managed Care – PPO | Admitting: Infectious Diseases

## 2020-07-19 ENCOUNTER — Other Ambulatory Visit: Payer: Self-pay

## 2020-07-19 VITALS — BP 137/70 | HR 70 | Wt 179.0 lb

## 2020-07-19 DIAGNOSIS — R61 Generalized hyperhidrosis: Secondary | ICD-10-CM | POA: Diagnosis not present

## 2020-07-19 NOTE — Patient Instructions (Signed)
Please sign up for my chart  If you have new fevers, chills, sweats, take your temperature and we can communicate via my chart

## 2020-07-19 NOTE — Progress Notes (Addendum)
[facility]                                                             [place], [place], [place], [address]                                                                  Phn. [phone]; Fax: [phone]                                                                             Date: 07/19/20  Reason for Follow Up- sweating   Assessment/Plan 64 Year old Caucasian Female with a PMH of Breast ca s/p Lumpectomy and radiation + tamoxifien ( on remission), h/o melanoma under remission per patient, OA referred for evaluation of night sweats for approx 2 months. Cause in unclear at this time.   Denies having fevers associated with night sweats. She did tell me sweating is mostly at night and would sometimes soak up her bed sheets/clothes. ROS was negative except lower lumbar back pain.   Work up fairly unremarkable except elevated CRP to 6, possibly reactive and  MRI L spine and pelvis WO contrast showing degenerative disc disease and spinal foraminal narrowing. Blood culture 9/20 NGTD. TSH wnl. TB quantiferon negative. HIV and HCV ab negative.  Fortunately, patient has not had any sweating episodes/fever/chills since last visit approx 3 weeks ago. She is in menopause ( ? Menopausal symptoms).   I think it is reasonable to hold off on further work up at this time given she has not had sweating for almost 3 weeks and do, wait and watch. I have asked her to message me on my chart if she starts developing sweats again and if so, to measure her temperature at that time. Will need to expand her work up at that time.   Follow up with Dr Rip Harbour for management of her back pain Follow up with me PRN  All questions and concerns were discussed and addressed.  I spent greater than 25  minutes with the patient including greater than 50% of time in face to face counsel of the patient.   Rosiland Oz, [facility]  Office phone [phone] Fax no. [phone] ______________________________________________________________________________________________________________________ Subjective  Patient is here for follow up of sweats. She says she has not had any sweating since she was seen in the clinic last time approx 3 weeks ago. Denies any fever, chills. Only complaint is back pain radiating to the rt leg. MRI L spine and pelvis showed degenerative disc disease of the lower lumbar spine with spinal foraminal  Narrowing.  Denies any new rashes, joint pain/swelling. Denies change in appetite or change in weight.  Last time traveled was in May 2021  and went to a beach for vacation. Denies any insects or tick bite No new lumps/bumps/swelling in the body. No new medications    ROS: Constitutional: Negative for fever, chills, activity change, appetite change, fatigue and unexpected weight change.  HENT: Negative for congestion, sore throat, rhinorrhea, sneezing, trouble swallowing and sinus pressure.  Eyes: Negative for photophobia and visual disturbance.  Respiratory: Negative for cough, chest tightness, shortness of breath, wheezing and stridor.  Cardiovascular: Negative for chest pain, palpitations and leg swelling.  Gastrointestinal: Negative for nausea, vomiting, abdominal pain, diarrhea, constipation, blood in stool, abdominal distention and anal bleeding.  Genitourinary: Negative for dysuria, hematuria, flank pain and difficulty urinating.  Musculoskeletal: Negative for myalgias,  joint swelling, arthralgias and gait problem.  Skin: Negative for color change, pallor, rash and wound.  Neurological: Negative for dizziness, tremors, weakness and light-headedness.  Hematological: Negative for adenopathy. Does not bruise/bleed easily.  Psychiatric/Behavioral: Negative for behavioral problems, confusion, sleep disturbance, dysphoric mood, decreased  concentration and agitation.   Past Medical History:  Diagnosis Date  . Arthritis   . Breast cancer ([place]) 2000  . Cancer ([place]) 2013   Left forearm  . Cancer ([place]) 2000   breast  . Melanoma ([place])    Past Surgical History:  Procedure Laterality Date  . BREAST SURGERY  2000   Lumpectomy-axillary node dissection  . FRACTURE SURGERY  2005   fx rt lower leg  . i&D axilla  03/07/12   Lt arm axilla  . LEG SURGERY    . lumpectomy    . MELANOMA EXCISION  03/07/12   lt forearm  . TUBAL LIGATION     Current Outpatient Medications on File Prior to Visit  Medication Sig Dispense Refill  . Multiple Vitamin (MULTIVITAMIN WITH MINERALS) TABS Take 1 tablet by mouth every morning.    . traMADol (ULTRAM) 50 MG tablet SMARTSIG:0.5-1 Tablet(s) By Mouth 2-3 Times Daily PRN    . cetirizine (ZYRTEC) 10 MG chewable tablet Chew 10 mg by mouth daily. (Patient not taking: Reported on 06/28/2020)    . ibuprofen (ADVIL,MOTRIN) 200 MG tablet Take 200 mg by mouth every 6 (six) hours as needed. (Patient not taking: Reported on 06/28/2020)    . ibuprofen (ADVIL,MOTRIN) 800 MG tablet Take 1 tablet (800 mg total) by mouth every 8 (eight) hours as needed. (Patient not taking: Reported on 06/28/2020) 21 tablet 0  . MELOXICAM PO Take by mouth. (Patient not taking: Reported on 06/28/2020)    . oxyCODONE-acetaminophen (PERCOCET/ROXICET) 5-325 MG per tablet Take 1 tablet by mouth every 6 (six) hours as needed for severe pain. (Patient not taking: Reported on 03/24/2015) 15 tablet 0   No current facility-administered medications on file prior to visit.   Allergies  Allergen Reactions  . Codeine Anaphylaxis  . Penicillins Anaphylaxis  . Shellfish Allergy   . Gadolinium Derivatives Itching    16 ml Multihance coughing and throat itching in less that 1 minute after injection symptoms 90% resolved by end of exam   Social History   Socioeconomic History  . Marital status: Divorced    Spouse name: Not on file  . Number of  children: Not on file  . Years of education: Not on file  . Highest education level: Not on file  Occupational History  . Not on file  Tobacco Use  . Smoking status: Current Every Day Smoker    Types: Cigarettes  . Smokeless tobacco: Never Used  Vaping Use  . Vaping Use: Never used  Substance and  Sexual Activity  . Alcohol use: Not Currently  . Drug use: No  . Sexual activity: Not on file  Other Topics Concern  . Not on file  Social History Narrative   ** Merged History Encounter **       Social Determinants of Health   Financial Resource Strain:   . Difficulty of Paying Living Expenses: Not on file  Food Insecurity:   . Worried About Charity fundraiser in the Last Year: Not on file  . Ran Out of Food in the Last Year: Not on file  Transportation Needs:   . Lack of Transportation (Medical): Not on file  . Lack of Transportation (Non-Medical): Not on file  Physical Activity:   . Days of Exercise per Week: Not on file  . Minutes of Exercise per Session: Not on file  Stress:   . Feeling of Stress : Not on file  Social Connections:   . Frequency of Communication with Friends and Family: Not on file  . Frequency of Social Gatherings with Friends and Family: Not on file  . Attends Religious Services: Not on file  . Active Member of Clubs or Organizations: Not on file  . Attends Archivist Meetings: Not on file  . Marital Status: Not on file  Intimate Partner Violence:   . Fear of Current or Ex-Partner: Not on file  . Emotionally Abused: Not on file  . Physically Abused: Not on file  . Sexually Abused: Not on file    Vitals BP 137/70   Pulse 70   Wt 179 lb (81.2 kg)   SpO2 99%   BMI 27.22 kg/m    Examination  General - not in acute distress, comfortably sitting in chair HEENT - PEERLA, no pallor and no icterus Chest - b/l clear air entry, no additional sounds CVS- Normal s1s2, RRR Abdomen - Soft, Non tender , non distended Ext- no pedal  edema Neuro: grossly normal Psych : calm and cooperative  Recent labs CBC Latest Ref Rng & Units 05/26/2020 03/20/2017 03/21/2016  WBC 4.0 - 10.5 K/uL 12.8(H) 7.6 8.2  Hemoglobin 12.0 - 15.0 g/dL 11.9(L) 13.1 12.8  Hematocrit 36 - 46 % 37.7 41.0 39.1  Platelets 150 - 400 K/uL 376 232 231   CMP Latest Ref Rng & Units 05/26/2020 03/20/2017 03/21/2016  Glucose 70 - 99 mg/dL 99 87 84  BUN 8 - 23 mg/dL 11 12.5 11.3  Creatinine 0.44 - 1.00 mg/dL 0.67 0.9 0.8  Sodium 135 - 145 mmol/L 135 141 139  Potassium 3.5 - 5.1 mmol/L 4.0 4.3 4.4  Chloride 98 - 111 mmol/L 99 - -  CO2 22 - 32 mmol/L 25 25 25   Calcium 8.9 - 10.3 mg/dL 9.7 10.2 10.0  Total Protein 6.4 - 8.3 g/dL - 7.5 7.4  Total Bilirubin 0.20 - 1.20 mg/dL - 0.47 0.43  Alkaline Phos 40 - 150 U/L - 81 71  AST 5 - 34 U/L - 12 14  ALT 0 - 55 U/L - 16 21     Pertinent Microbiology Results for orders placed or performed in visit on 06/28/20  Blood culture (routine single)     Status: None   Collection Time: 06/28/20  3:46 PM   Specimen: Blood  Result Value Ref Range Status   MICRO NUMBER: [id number]  Final   SPECIMEN QUALITY: Suboptimal  Final   Source BLOOD 1  Final   STATUS: FINAL  Final   Result: No growth after 5 days  Final   COMMENT: Aerobic and anaerobic bottle received.  Final    Pertinent Imaging MRI pelvis WO contrast 07/13/20 FINDINGS: Musculoskeletal: No acute fracture. No dislocation. No femoral head avascular necrosis. Subcortical cystic changes along the anterior aspect of the left femoral neck likely reflecting synovial herniation pit or intraosseous ganglia. Area of heterogeneously hyperintense T2 signal within the right hemi sacrum along the anterior aspect of the right SI joint measuring 2.8 x 1.1 cm (series 3, image 27) with associated patchy enhancement and heterogeneously hyperintense T1 signal. Prior CT from 2017 demonstrates patchy sclerosis at this site. Findings are favored to represent an intraosseous  vascular lesion such as a hemangioma. There are no suspicious marrow replacing lesions within the pelvis or visualized proximal femora. Degenerative disc disease within the visualized lower lumbar spine, more fully characterized on dedicated same-day lumbar spine exam.  The bilateral hip joint spaces are maintained without significant arthropathy. No hip joint effusion. Elongated T2 hyperintense structure tracks along the posterior right acetabular wall extending from the posterior aspect of the right hip joint, favored to represent a paralabral cyst (series 6, images 21-22). Mild degenerative changes of the bilateral SI joints.  Normal muscle bulk and signal intensity. Tendinosis of the left gluteus medius tendon. Right gluteal tendons intact. The bilateral hamstring, iliopsoas, rectus femoris, and adductor tendons appear intact without tear or significant tendinosis. No bursal fluid collections.  Urinary Tract:  No abnormality visualized.  Bowel: Minimal scattered sigmoid diverticulosis. No inflammatory changes visualized. Visualized small bowel loops within the pelvis are unremarkable.  Vascular/Lymphatic: No pathologically enlarged lymph nodes. No significant vascular abnormality seen.  Reproductive: Retroverted uterus. Multiple nabothian cysts at the level of the cervix. No adnexal masses.  Other:  No free fluid within the pelvis.  IMPRESSION: 1. No acute osseous abnormality of the pelvis. 2. Mild left gluteus medius tendinosis. 3. Elongated cystic structure extending along the posterior aspect of the right acetabulum, favored to represent a paralabral cyst. This could be further evaluated with MR arthrography if clinically warranted. 4. Mild degenerative changes of the bilateral SI joints. 5. Area of signal abnormality in the right hemisacrum along the anterior aspect of the right SI joint is favored to represent an intraosseous vascular lesion such as a  hemangioma. No suspicious marrow replacing lesion.  MRI Lumbar spine WO contrast 07/13/20 FINDINGS: Segmentation:  Standard.  Alignment:  Minimal anterolisthesis of L4 over L5.  Vertebrae: No fracture, evidence of discitis, or bone lesion. Prominent endplate degenerative changes at L5-S1.  Conus medullaris and cauda equina: Conus extends to the L1 level. Conus and cauda equina appear normal.  Paraspinal and other soft tissues: Small bilateral renal cysts.  Disc levels:  T11-12: Central disc extrusion migrating superiorly and causing indentation on the thecal sac. No neural foraminal narrowing. This level is only evaluated on the sagittal images.  T12-L1: Tiny right central disc protrusion. No spinal canal or neural foraminal stenosis.  L1-2: Tiny left central disc protrusion. No spinal canal or neural foraminal stenosis.  L2-3: Shallow disc bulge and mild facet degenerative changes. No spinal canal or neural foraminal stenosis.  L3-4: Shallow disc bulge and moderate facet degenerative changes. No spinal canal or neural foraminal stenosis.  L4-5: Disc bulge with superimposed left central/subarticular disc extrusion migrating superiorly and impinging on the left L4 nerve root. Moderate spinal canal stenosis with effacement of the left subarticular zone. No significant neural foraminal narrowing.  L5-S1: Loss of disc height, minimal disc bulge and mild facet degenerative changes. No  spinal canal or neural foraminal stenosis.  IMPRESSION: 1. L4-L5 disc bulge with superimposed left central/subarticular disc extrusion migrating superiorly, impinging on the left L4 nerve root and resulting in moderate spinal canal stenosis with effacement of the left subarticular zone. 2. Central disc extrusion at T11 migrating superiorly, partially evaluated.   All pertinent labs/Imagings/notes reviewed. All pertinent plain films and CT images have been personally  visualized and interpreted; radiology reports have been reviewed. Decision making incorporated into the Impression / Recommendations.

## 2020-07-20 DIAGNOSIS — F17210 Nicotine dependence, cigarettes, uncomplicated: Secondary | ICD-10-CM | POA: Diagnosis not present

## 2020-07-20 DIAGNOSIS — M48061 Spinal stenosis, lumbar region without neurogenic claudication: Secondary | ICD-10-CM | POA: Diagnosis not present

## 2020-07-20 DIAGNOSIS — Z716 Tobacco abuse counseling: Secondary | ICD-10-CM | POA: Diagnosis not present

## 2020-08-17 DIAGNOSIS — M48061 Spinal stenosis, lumbar region without neurogenic claudication: Secondary | ICD-10-CM | POA: Diagnosis not present

## 2020-09-13 DIAGNOSIS — M48062 Spinal stenosis, lumbar region with neurogenic claudication: Secondary | ICD-10-CM | POA: Diagnosis not present

## 2021-02-22 DIAGNOSIS — M48062 Spinal stenosis, lumbar region with neurogenic claudication: Secondary | ICD-10-CM | POA: Diagnosis not present

## 2021-03-09 DIAGNOSIS — M48062 Spinal stenosis, lumbar region with neurogenic claudication: Secondary | ICD-10-CM | POA: Diagnosis not present

## 2021-03-29 DIAGNOSIS — M48062 Spinal stenosis, lumbar region with neurogenic claudication: Secondary | ICD-10-CM | POA: Diagnosis not present

## 2021-08-15 DIAGNOSIS — J069 Acute upper respiratory infection, unspecified: Secondary | ICD-10-CM | POA: Diagnosis not present

## 2021-10-11 DIAGNOSIS — R058 Other specified cough: Secondary | ICD-10-CM | POA: Diagnosis not present

## 2021-10-11 DIAGNOSIS — Z720 Tobacco use: Secondary | ICD-10-CM | POA: Diagnosis not present

## 2021-10-31 DIAGNOSIS — Z720 Tobacco use: Secondary | ICD-10-CM | POA: Diagnosis not present

## 2021-10-31 DIAGNOSIS — R053 Chronic cough: Secondary | ICD-10-CM | POA: Diagnosis not present

## 2021-10-31 DIAGNOSIS — M48062 Spinal stenosis, lumbar region with neurogenic claudication: Secondary | ICD-10-CM | POA: Diagnosis not present

## 2021-11-08 DIAGNOSIS — M48062 Spinal stenosis, lumbar region with neurogenic claudication: Secondary | ICD-10-CM | POA: Diagnosis not present

## 2021-12-05 DIAGNOSIS — M48061 Spinal stenosis, lumbar region without neurogenic claudication: Secondary | ICD-10-CM | POA: Diagnosis not present

## 2022-01-03 DIAGNOSIS — Z122 Encounter for screening for malignant neoplasm of respiratory organs: Secondary | ICD-10-CM | POA: Diagnosis not present

## 2022-01-03 DIAGNOSIS — R053 Chronic cough: Secondary | ICD-10-CM | POA: Diagnosis not present

## 2022-01-03 DIAGNOSIS — Z1239 Encounter for other screening for malignant neoplasm of breast: Secondary | ICD-10-CM | POA: Diagnosis not present

## 2022-01-06 ENCOUNTER — Other Ambulatory Visit: Payer: Self-pay | Admitting: Family Medicine

## 2022-01-06 DIAGNOSIS — Z1231 Encounter for screening mammogram for malignant neoplasm of breast: Secondary | ICD-10-CM

## 2022-01-31 DIAGNOSIS — Z122 Encounter for screening for malignant neoplasm of respiratory organs: Secondary | ICD-10-CM | POA: Diagnosis not present

## 2022-01-31 DIAGNOSIS — F17210 Nicotine dependence, cigarettes, uncomplicated: Secondary | ICD-10-CM | POA: Diagnosis not present

## 2022-02-27 ENCOUNTER — Ambulatory Visit
Admission: RE | Admit: 2022-02-27 | Discharge: 2022-02-27 | Disposition: A | Discharge status: Home / Self Care | Payer: BC Managed Care – PPO | Source: Ambulatory Visit | Attending: Family Medicine | Admitting: Family Medicine

## 2022-02-27 DIAGNOSIS — Z1231 Encounter for screening mammogram for malignant neoplasm of breast: Secondary | ICD-10-CM

## 2022-03-01 ENCOUNTER — Other Ambulatory Visit: Payer: Self-pay | Admitting: Family Medicine

## 2022-03-01 DIAGNOSIS — R928 Other abnormal and inconclusive findings on diagnostic imaging of breast: Secondary | ICD-10-CM

## 2022-03-13 ENCOUNTER — Ambulatory Visit
Admission: RE | Admit: 2022-03-13 | Discharge: 2022-03-13 | Disposition: A | Discharge status: Home / Self Care | Payer: BC Managed Care – PPO | Source: Ambulatory Visit | Attending: Family Medicine | Admitting: Family Medicine

## 2022-03-13 DIAGNOSIS — R928 Other abnormal and inconclusive findings on diagnostic imaging of breast: Secondary | ICD-10-CM | POA: Diagnosis not present

## 2022-03-13 DIAGNOSIS — N6311 Unspecified lump in the right breast, upper outer quadrant: Secondary | ICD-10-CM | POA: Diagnosis not present

## 2022-04-17 DIAGNOSIS — M48062 Spinal stenosis, lumbar region with neurogenic claudication: Secondary | ICD-10-CM | POA: Diagnosis not present

## 2022-04-25 DIAGNOSIS — M48062 Spinal stenosis, lumbar region with neurogenic claudication: Secondary | ICD-10-CM | POA: Diagnosis not present

## 2022-10-16 DIAGNOSIS — J4521 Mild intermittent asthma with (acute) exacerbation: Secondary | ICD-10-CM | POA: Diagnosis not present

## 2022-12-25 DIAGNOSIS — B370 Candidal stomatitis: Secondary | ICD-10-CM | POA: Diagnosis not present

## 2022-12-25 DIAGNOSIS — J4521 Mild intermittent asthma with (acute) exacerbation: Secondary | ICD-10-CM | POA: Diagnosis not present

## 2023-11-15 DIAGNOSIS — Z03818 Encounter for observation for suspected exposure to other biological agents ruled out: Secondary | ICD-10-CM | POA: Diagnosis not present

## 2023-11-15 DIAGNOSIS — J101 Influenza due to other identified influenza virus with other respiratory manifestations: Secondary | ICD-10-CM | POA: Diagnosis not present

## 2023-11-15 DIAGNOSIS — R051 Acute cough: Secondary | ICD-10-CM | POA: Diagnosis not present

## 2024-02-06 DIAGNOSIS — Z0000 Encounter for general adult medical examination without abnormal findings: Secondary | ICD-10-CM | POA: Diagnosis not present

## 2024-02-06 DIAGNOSIS — G8929 Other chronic pain: Secondary | ICD-10-CM | POA: Diagnosis not present

## 2024-02-06 DIAGNOSIS — Z124 Encounter for screening for malignant neoplasm of cervix: Secondary | ICD-10-CM | POA: Diagnosis not present

## 2024-02-06 DIAGNOSIS — Z1231 Encounter for screening mammogram for malignant neoplasm of breast: Secondary | ICD-10-CM | POA: Diagnosis not present

## 2024-02-06 DIAGNOSIS — Z6827 Body mass index (BMI) 27.0-27.9, adult: Secondary | ICD-10-CM | POA: Diagnosis not present

## 2024-02-06 DIAGNOSIS — Z1211 Encounter for screening for malignant neoplasm of colon: Secondary | ICD-10-CM | POA: Diagnosis not present

## 2024-02-06 DIAGNOSIS — M545 Low back pain, unspecified: Secondary | ICD-10-CM | POA: Diagnosis not present

## 2024-02-06 DIAGNOSIS — E782 Mixed hyperlipidemia: Secondary | ICD-10-CM | POA: Diagnosis not present

## 2024-02-08 ENCOUNTER — Other Ambulatory Visit: Payer: Self-pay | Admitting: Family Medicine

## 2024-02-08 ENCOUNTER — Encounter: Payer: Self-pay | Admitting: Family Medicine

## 2024-02-08 DIAGNOSIS — Z1231 Encounter for screening mammogram for malignant neoplasm of breast: Secondary | ICD-10-CM

## 2024-03-05 ENCOUNTER — Ambulatory Visit
Admission: RE | Admit: 2024-03-05 | Discharge: 2024-03-05 | Disposition: A | Discharge status: Home / Self Care | Source: Ambulatory Visit | Attending: Family Medicine | Admitting: Family Medicine

## 2024-03-05 DIAGNOSIS — Z1231 Encounter for screening mammogram for malignant neoplasm of breast: Secondary | ICD-10-CM

## 2024-03-19 DIAGNOSIS — D125 Benign neoplasm of sigmoid colon: Secondary | ICD-10-CM | POA: Diagnosis not present

## 2024-03-19 DIAGNOSIS — Z800 Family history of malignant neoplasm of digestive organs: Secondary | ICD-10-CM | POA: Diagnosis not present

## 2024-03-19 DIAGNOSIS — K635 Polyp of colon: Secondary | ICD-10-CM | POA: Diagnosis not present

## 2024-03-19 DIAGNOSIS — Z09 Encounter for follow-up examination after completed treatment for conditions other than malignant neoplasm: Secondary | ICD-10-CM | POA: Diagnosis not present

## 2024-03-19 DIAGNOSIS — Z860100 Personal history of colon polyps, unspecified: Secondary | ICD-10-CM | POA: Diagnosis not present

## 2024-03-24 DIAGNOSIS — K635 Polyp of colon: Secondary | ICD-10-CM | POA: Diagnosis not present

## 2024-03-24 DIAGNOSIS — D125 Benign neoplasm of sigmoid colon: Secondary | ICD-10-CM | POA: Diagnosis not present
